# Patient Record
Sex: Male | Born: 1945 | Race: White | Hispanic: No | Marital: Married | Smoking: Never smoker
Health system: Southern US, Community
[De-identification: ages and names within clinical notes are randomized; demographics above are authoritative.]

## PROBLEM LIST (undated history)

## (undated) DIAGNOSIS — C4490 Unspecified malignant neoplasm of skin, unspecified: Secondary | ICD-10-CM

## (undated) DIAGNOSIS — G8929 Other chronic pain: Secondary | ICD-10-CM

## (undated) DIAGNOSIS — G909 Disorder of the autonomic nervous system, unspecified: Secondary | ICD-10-CM

## (undated) DIAGNOSIS — E079 Disorder of thyroid, unspecified: Secondary | ICD-10-CM

## (undated) DIAGNOSIS — I10 Essential (primary) hypertension: Secondary | ICD-10-CM

## (undated) DIAGNOSIS — I7782 Antineutrophilic cytoplasmic antibody (ANCA) vasculitis: Secondary | ICD-10-CM

## (undated) DIAGNOSIS — G4730 Sleep apnea, unspecified: Secondary | ICD-10-CM

## (undated) DIAGNOSIS — G629 Polyneuropathy, unspecified: Secondary | ICD-10-CM

## (undated) DIAGNOSIS — M109 Gout, unspecified: Secondary | ICD-10-CM

## (undated) DIAGNOSIS — D899 Disorder involving the immune mechanism, unspecified: Secondary | ICD-10-CM

## (undated) DIAGNOSIS — I7121 Aneurysm of the ascending aorta, without rupture: Secondary | ICD-10-CM

## (undated) HISTORY — DX: Unspecified malignant neoplasm of skin, unspecified: C44.90

## (undated) HISTORY — PX: GALLBLADDER SURGERY: SHX652

## (undated) HISTORY — DX: Disorder of the autonomic nervous system, unspecified: G90.9

## (undated) HISTORY — DX: Disorder involving the immune mechanism, unspecified: D89.9

## (undated) HISTORY — DX: Other chronic pain: G89.29

## (undated) HISTORY — PX: TOTAL HIP ARTHROPLASTY: SHX124

## (undated) HISTORY — PX: CHOLECYSTECTOMY: SHX55

---

## 2017-02-03 ENCOUNTER — Inpatient Hospital Stay
Admission: EM | Admit: 2017-02-03 | Discharge: 2017-02-04 | DRG: 390 | Disposition: A | Discharge status: Home / Self Care | Payer: Medicare Other | Attending: Internal Medicine | Admitting: Internal Medicine

## 2017-02-03 ENCOUNTER — Emergency Department: Payer: Medicare Other

## 2017-02-03 ENCOUNTER — Encounter: Payer: Self-pay | Admitting: Emergency Medicine

## 2017-02-03 ENCOUNTER — Inpatient Hospital Stay: Payer: Medicare Other

## 2017-02-03 DIAGNOSIS — K567 Ileus, unspecified: Secondary | ICD-10-CM | POA: Diagnosis present

## 2017-02-03 DIAGNOSIS — Z7982 Long term (current) use of aspirin: Secondary | ICD-10-CM

## 2017-02-03 DIAGNOSIS — K56609 Unspecified intestinal obstruction, unspecified as to partial versus complete obstruction: Secondary | ICD-10-CM

## 2017-02-03 DIAGNOSIS — M109 Gout, unspecified: Secondary | ICD-10-CM | POA: Diagnosis not present

## 2017-02-03 DIAGNOSIS — E860 Dehydration: Secondary | ICD-10-CM | POA: Diagnosis not present

## 2017-02-03 DIAGNOSIS — Z4659 Encounter for fitting and adjustment of other gastrointestinal appliance and device: Secondary | ICD-10-CM

## 2017-02-03 DIAGNOSIS — E039 Hypothyroidism, unspecified: Secondary | ICD-10-CM | POA: Diagnosis present

## 2017-02-03 DIAGNOSIS — I10 Essential (primary) hypertension: Secondary | ICD-10-CM | POA: Diagnosis not present

## 2017-02-03 DIAGNOSIS — K529 Noninfective gastroenteritis and colitis, unspecified: Secondary | ICD-10-CM | POA: Diagnosis not present

## 2017-02-03 HISTORY — DX: Gout, unspecified: M10.9

## 2017-02-03 HISTORY — DX: Essential (primary) hypertension: I10

## 2017-02-03 HISTORY — DX: Disorder of thyroid, unspecified: E07.9

## 2017-02-03 LAB — CBC
HEMATOCRIT: 47.9 % (ref 40.0–52.0)
HEMOGLOBIN: 16.3 g/dL (ref 13.0–18.0)
MCH: 30.9 pg (ref 26.0–34.0)
MCHC: 34.1 g/dL (ref 32.0–36.0)
MCV: 90.6 fL (ref 80.0–100.0)
Platelets: 184 10*3/uL (ref 150–440)
RBC: 5.29 MIL/uL (ref 4.40–5.90)
RDW: 14.7 % — ABNORMAL HIGH (ref 11.5–14.5)
WBC: 6.5 10*3/uL (ref 3.8–10.6)

## 2017-02-03 LAB — COMPREHENSIVE METABOLIC PANEL
ALT: 26 U/L (ref 17–63)
ANION GAP: 7 (ref 5–15)
AST: 26 U/L (ref 15–41)
Albumin: 4 g/dL (ref 3.5–5.0)
Alkaline Phosphatase: 51 U/L (ref 38–126)
BUN: 22 mg/dL — ABNORMAL HIGH (ref 6–20)
CALCIUM: 9.2 mg/dL (ref 8.9–10.3)
CHLORIDE: 100 mmol/L — AB (ref 101–111)
CO2: 27 mmol/L (ref 22–32)
Creatinine, Ser: 1.11 mg/dL (ref 0.61–1.24)
GFR calc Af Amer: >60 mL/min (ref >60)
GFR calc non Af Amer: >60 mL/min (ref >60)
Glucose, Bld: 117 mg/dL — ABNORMAL HIGH (ref 65–99)
POTASSIUM: 3.7 mmol/L (ref 3.5–5.1)
SODIUM: 134 mmol/L — AB (ref 135–145)
Total Bilirubin: 0.9 mg/dL (ref 0.3–1.2)
Total Protein: 7.6 g/dL (ref 6.5–8.1)

## 2017-02-03 LAB — URINALYSIS, COMPLETE (UACMP) WITH MICROSCOPIC
Bacteria, UA: NONE SEEN
Bilirubin Urine: NEGATIVE
Glucose, UA: NEGATIVE mg/dL
Hgb urine dipstick: NEGATIVE
KETONES UR: NEGATIVE mg/dL
Leukocytes, UA: NEGATIVE
Nitrite: NEGATIVE
PROTEIN: 30 mg/dL — AB
RBC / HPF: NONE SEEN RBC/hpf (ref 0–5)
Specific Gravity, Urine: 1.029 (ref 1.005–1.030)
Squamous Epithelial / LPF: NONE SEEN
WBC UA: NONE SEEN WBC/hpf (ref 0–5)
pH: 5 (ref 5.0–8.0)

## 2017-02-03 LAB — LIPASE, BLOOD: LIPASE: 20 U/L (ref 11–51)

## 2017-02-03 MED ORDER — IOPAMIDOL (ISOVUE-300) INJECTION 61%
100.0000 mL | Freq: Once | INTRAVENOUS | Status: AC | PRN
  Administered 2017-02-03: 100 mL via INTRAVENOUS
  Filled 2017-02-03: qty 100

## 2017-02-03 MED ORDER — ONDANSETRON HCL 4 MG PO TABS
4.0000 mg | ORAL_TABLET | Freq: Once | ORAL | Status: AC
  Administered 2017-02-03: 4 mg via ORAL
  Filled 2017-02-03: qty 1

## 2017-02-03 MED ORDER — KETOROLAC TROMETHAMINE 15 MG/ML IJ SOLN: 15.0000 mg | Freq: Four times a day (QID) | INTRAMUSCULAR | Status: DC | PRN

## 2017-02-03 MED ORDER — ONDANSETRON HCL 4 MG PO TABS: 4.0000 mg | ORAL_TABLET | Freq: Four times a day (QID) | ORAL | Status: DC | PRN

## 2017-02-03 MED ORDER — HYDRALAZINE HCL 20 MG/ML IJ SOLN: 10.0000 mg | Freq: Four times a day (QID) | INTRAMUSCULAR | Status: DC | PRN

## 2017-02-03 MED ORDER — HYDROCODONE-ACETAMINOPHEN 5-325 MG PO TABS: 1.0000 | ORAL_TABLET | ORAL | Status: DC | PRN

## 2017-02-03 MED ORDER — LEVOTHYROXINE SODIUM 100 MCG IV SOLR
37.5000 ug | Freq: Every day | INTRAVENOUS | Status: DC
  Administered 2017-02-03: 37.5 ug via INTRAVENOUS
  Filled 2017-02-03 (×2): qty 5

## 2017-02-03 MED ORDER — ACETAMINOPHEN 325 MG PO TABS
650.0000 mg | ORAL_TABLET | Freq: Four times a day (QID) | ORAL | Status: DC | PRN
Start: 2017-02-03 — End: 2017-02-04

## 2017-02-03 MED ORDER — ONDANSETRON HCL 4 MG/2ML IJ SOLN: 4.0000 mg | Freq: Four times a day (QID) | INTRAMUSCULAR | Status: DC | PRN

## 2017-02-03 MED ORDER — GI COCKTAIL ~~LOC~~
30.0000 mL | Freq: Once | ORAL | Status: AC
  Administered 2017-02-03: 30 mL via ORAL
  Filled 2017-02-03: qty 30

## 2017-02-03 MED ORDER — SENNOSIDES-DOCUSATE SODIUM 8.6-50 MG PO TABS: 1.0000 | ORAL_TABLET | Freq: Every evening | ORAL | Status: DC | PRN

## 2017-02-03 MED ORDER — ENOXAPARIN SODIUM 40 MG/0.4ML ~~LOC~~ SOLN: 40.0000 mg | SUBCUTANEOUS | Status: DC

## 2017-02-03 MED ORDER — DICYCLOMINE HCL 10 MG PO CAPS
10.0000 mg | ORAL_CAPSULE | Freq: Once | ORAL | Status: AC
  Administered 2017-02-03: 10 mg via ORAL
  Filled 2017-02-03: qty 1

## 2017-02-03 MED ORDER — SODIUM CHLORIDE 0.9 % IV SOLN
INTRAVENOUS | Status: DC
  Administered 2017-02-03 – 2017-02-04 (×2): via INTRAVENOUS

## 2017-02-03 MED ORDER — IOPAMIDOL (ISOVUE-300) INJECTION 61%
30.0000 mL | Freq: Once | INTRAVENOUS | Status: AC
  Administered 2017-02-03: 30 mL via ORAL
  Filled 2017-02-03: qty 30

## 2017-02-03 MED ORDER — ACETAMINOPHEN 650 MG RE SUPP
650.0000 mg | Freq: Four times a day (QID) | RECTAL | Status: DC | PRN
Start: 2017-02-03 — End: 2017-02-04

## 2017-02-03 MED ORDER — KETOROLAC TROMETHAMINE 15 MG/ML IJ SOLN
15.0000 mg | Freq: Four times a day (QID) | INTRAMUSCULAR | Status: DC | PRN
  Filled 2017-02-03: qty 1

## 2017-02-03 MED ORDER — SODIUM CHLORIDE 0.9 % IV BOLUS (SEPSIS)
1000.0000 mL | Freq: Once | INTRAVENOUS | Status: AC
  Administered 2017-02-03: 1000 mL via INTRAVENOUS

## 2017-02-03 NOTE — ED Notes (Signed)
Pt reports that he is having abd pain located midline of abd with nausea/vomiting and diarrhea - in 24 hours vomited x4 - in 24 hours 5 loose stools - pt has been sick since yesterday am

## 2017-02-03 NOTE — ED Notes (Signed)
Pt assisted to bathroom and was able to void and pass a large amount of gas - suction canister changed

## 2017-02-03 NOTE — ED Triage Notes (Addendum)
Pt to ed with c/o nausea, vomiting, diarrhea, and abd pain x 2 days.

## 2017-02-03 NOTE — Consult Note (Signed)
Patient ID: SEAB AXEL, male   DOB: 1946/06/05, 71 y.o.   MRN: [id number]  HPI Logan Murphy is a 71 y.o. male asked to see in consultation by Dr. Clearnce Hasten. He reports that his symptoms started yesterday with nausea vomiting and diarrhea. He had about 5 episodes of watery bowel movement yesterday and about 2 episodes of emesis. He reports some mild to moderate intermittent crampy abdominal pain. Pain was moderate in intensity and diffusely. No significant precipitating or out of eating factors. He has never had any episodes like this and denies any sick contacts. He has good cardiovascular performance and is able to do more than 4 Mets without shortness of breath or chest pain.  The workup including a CT scan of the abdomen and pelvis that I have personally reviewed. There is evidence of dilated loops of small bowel diffusely without any transition zone. There is no evidence of pneumatosis there is no evidence of free air. There is no evidence of bowel ischemia. There is air within the colon. Nml WBC  HPI  Past Medical History:  Diagnosis Date  . Gout   . Hypertension   . Thyroid disease     History reviewed. No pertinent surgical history.  History reviewed. No pertinent family history.  Social History Social History  Substance Use Topics  . Smoking status: Never Smoker  . Smokeless tobacco: Never Used  . Alcohol use Yes    No Known Allergies  No current facility-administered medications for this encounter.    Current Outpatient Prescriptions  Medication Sig Dispense Refill  . allopurinol (ZYLOPRIM) 300 MG tablet Take 300 mg by mouth daily.    Marland Kitchen aspirin EC 81 MG tablet Take 81 mg by mouth daily.    . Cholecalciferol 400 units CAPS Take 400 Units by mouth daily.     Marland Kitchen levothyroxine (SYNTHROID, LEVOTHROID) 75 MCG tablet Take 75 mcg by mouth daily.    Marland Kitchen lisinopril-hydrochlorothiazide (PRINZIDE,ZESTORETIC) 10-12.5 MG tablet Take 1 tablet by mouth daily.       Review of  Systems A 10 point review of systems was asked and was negative except for the information on the HPI  Physical Exam Blood pressure (!) 151/77, pulse 79, temperature 97.6 F (36.4 C), temperature source Oral, resp. rate 18, height 6\' 1"  (1.854 m), weight 104.3 kg (230 lb), SpO2 95 %. CONSTITUTIONAL: NAD EYES: Pupils are equal, round, and reactive to light, Sclera are non-icteric. EARS, NOSE, MOUTH AND THROAT: The oropharynx is clear. The oral mucosa is pink and moist. Hearing is intact to voice. LYMPH NODES:  Lymph nodes in the neck are normal. RESPIRATORY:  Lungs are clear. There is normal respiratory effort, with equal breath sounds bilaterally, and without pathologic use of accessory muscles. CARDIOVASCULAR: Heart is regular without murmurs, gallops, or rubs. GI: The abdomen is  soft, nontender, distended. Decrease BS, no peritonitisGU: Rectal deferred.   MUSCULOSKELETAL: Normal muscle strength and tone. No cyanosis or edema.   SKIN: Turgor is good and there are no pathologic skin lesions or ulcers. NEUROLOGIC: Motor and sensation is grossly normal. Cranial nerves are grossly intact. PSYCH:  Oriented to person, place and time. Affect is normal.  Data Reviewed I have personally reviewed the patient's imaging, laboratory findings and medical records.    Assessment/Plan 71 year old male with clinical findings consistent with enteritis versus ileus. Clinically he does not have a bowel obstruction and radiographically there is evidence of air with in the colon. Recommend IV fluids, NG tube decompression since he  has significant dilation of the stomach. And correction of electrolytes and dehydration accordingly. No need for any surgical intervention at this time will be happy to be available if any surgical issues arise. Discussed with Dr.Schaevitz in detail.  Caroleen Hamman, MD FACS General Surgeon 02/03/2017, 6:14 PM

## 2017-02-03 NOTE — ED Provider Notes (Addendum)
[facility] Emergency Department Provider Note  ____________________________________________   First MD Initiated Contact with Patient 02/03/17 1427     (approximate)  I have reviewed the triage vital signs and the nursing notes.   HISTORY  Chief Complaint Nausea; Emesis; and Diarrhea   HPI Logan Murphy is a 71 y.o. male with a history of hypertension and cholecystectomy who is presenting emergency department with nausea vomiting and diarrhea as well as upper abdominal pain which she describes as a 4-10 and sharp at this time. He denies any radiation of the pain. Says that he has vomited onceand also had an episode of diarrhea this morning. He says that he is passing minimal amount of gas but usually passes more. He is also concerned about distention of his abdomen and if it could be obstructed. Also with slight cough. No known sick contacts.   Past Medical History:  Diagnosis Date  . Gout   . Hypertension   . Thyroid disease     There are no active problems to display for this patient.   History reviewed. No pertinent surgical history.  Prior to Admission medications   Not on File    Allergies Patient has no known allergies.  History reviewed. No pertinent family history.  Social History Social History  Substance Use Topics  . Smoking status: Never Smoker  . Smokeless tobacco: Never Used  . Alcohol use Yes    Review of Systems Constitutional: No fever/chills Eyes: No visual changes. ENT: No sore throat. Cardiovascular: Denies chest pain. Respiratory: Denies shortness of breath. Gastrointestinal:   No constipation. Genitourinary: Negative for dysuria. Musculoskeletal: Negative for back pain. Skin: Negative for rash. Neurological: Negative for headaches, focal weakness or numbness.  10-point ROS otherwise negative.  ____________________________________________   PHYSICAL EXAM:  VITAL SIGNS: ED Triage Vitals  Enc Vitals  Group     BP 02/03/17 1300 (!) 151/77     Pulse Rate 02/03/17 1300 79     Resp 02/03/17 1300 18     Temp 02/03/17 1300 97.6 F (36.4 C)     Temp Source 02/03/17 1300 Oral     SpO2 02/03/17 1300 95 %     Weight 02/03/17 1301 230 lb (104.3 kg)     Height 02/03/17 1301 6\' 1"  (1.854 m)     Head Circumference --      Peak Flow --      Pain Score 02/03/17 1302 2     Pain Loc --      Pain Edu? --      Excl. in [place]? --     Constitutional: Alert and oriented. Well appearing and in no acute distress. Eyes: Conjunctivae are normal. PERRL. EOMI. Head: Atraumatic. Nose: No congestion/rhinnorhea. Mouth/Throat: Mucous membranes are moist.   Neck: No stridor.   Cardiovascular: Normal rate, regular rhythm. Grossly normal heart sounds.   Respiratory: Normal respiratory effort.  No retractions. Lungs CTAB. Gastrointestinal: Soft With tenderness to left upper quadrant as well as the left side of the abdomen. Minimal left lower quadrant tenderness palpation. No right lower quadrant tenderness or right upper quadrant tenderness to palpation. The tenderness on the left side of the abdomen is mild to moderate without any rebound or guarding. No distention.  Musculoskeletal: No lower extremity tenderness nor edema.  No joint effusions. Neurologic:  Normal speech and language. No gross focal neurologic deficits are appreciated. No gait instability. Skin:  Skin is warm, dry and intact. No rash noted. Psychiatric: Mood and  affect are normal. Speech and behavior are normal.  ____________________________________________   LABS (all labs ordered are listed, but only abnormal results are displayed)  Labs Reviewed  COMPREHENSIVE METABOLIC PANEL - Abnormal; Notable for the following:       Result Value   Sodium 134 (*)    Chloride 100 (*)    Glucose, Bld 117 (*)    BUN 22 (*)    All other components within normal limits  CBC - Abnormal; Notable for the following:    RDW 14.7 (*)    All other components  within normal limits  URINALYSIS, COMPLETE (UACMP) WITH MICROSCOPIC - Abnormal; Notable for the following:    Color, Urine AMBER (*)    APPearance CLEAR (*)    Protein, ur 30 (*)    All other components within normal limits  LIPASE, BLOOD   ____________________________________________  EKG   ____________________________________________  RADIOLOGY    DG Abdomen 1 View (Final result)  Result time 02/03/17 15:40:22  Final result by Inez Catalina, MD (02/03/17 15:40:22)           Narrative:   CLINICAL DATA: Mid abdominal pain with nausea and vomiting for 1 day, initial encounter  EXAM: ABDOMEN - 1 VIEW  COMPARISON: None.  FINDINGS: Scattered large and small bowel gas is noted. Some dilated loops of small bowel are noted within the mid abdomen. This may represent a partial small bowel obstruction or small-bowel ileus. Correlation with the physical exam is recommended. No free air is seen. No abnormal mass is noted.  IMPRESSION: Multiple dilated loops of small bowel in the mid abdomen with evidence of colonic air. This may simply represent a small-bowel ileus although partial small bowel obstruction deserves consideration. Correlation with the physical exam is recommended.   Electronically Signed By: Inez Catalina M.D. On: 02/03/2017 15:40          CT Abdomen Pelvis W Contrast (Final result)  Result time 02/03/17 17:13:04  Final result by Enrique Sack, MD (02/03/17 17:13:04)           Narrative:   CLINICAL DATA: Mid abdominal pain, nausea, vomiting and diarrhea since yesterday morning.  EXAM: CT ABDOMEN AND PELVIS WITH CONTRAST  TECHNIQUE: Multidetector CT imaging of the abdomen and pelvis was performed using the standard protocol following bolus administration of intravenous contrast.  CONTRAST: 164mL ISOVUE-300 IOPAMIDOL (ISOVUE-300) INJECTION 61%  COMPARISON: Abdomen pelvis radiographs obtained earlier today.  FINDINGS: Lower chest:  Patchy opacity in the medial aspect of the lingula on the initial images, not included in its entirety. No pleural fluid.  Hepatobiliary: Diffuse low density of the liver relative to the spleen. Cholecystectomy clips.  Pancreas: Unremarkable. No pancreatic ductal dilatation or surrounding inflammatory changes.  Spleen: Normal in size without focal abnormality.  Adrenals/Urinary Tract: Adrenal glands are unremarkable. Kidneys are normal, without renal calculi, focal lesion, or hydronephrosis. Bladder is unremarkable.  Stomach/Bowel: Dilated stomach with reflux contrast in the distal esophagus. Multiple dilated loops of proximal jejunum with normal caliber distal small bowel. The transition to normal caliber small bowel appears to be gradual in the left lower abdomen without a discrete transition point identified. No bowel wall thickening or pneumatosis seen. Mild swirling of the small bowel mesentery in the mid abdomen. Multiple colonic diverticula. Normal appearing appendix.  Vascular/Lymphatic: Atheromatous arterial calcifications, including the abdominal aorta, without aneurysm. No enlarged lymph nodes.  Reproductive: Moderately enlarged prostate gland. This is protruding into the base of the urinary bladder.  Other: No hernia, free peritoneal  fluid or free peritoneal air seen.  Musculoskeletal: Lumbar and lower thoracic spine degenerative changes. Bilateral hip degenerative changes, greater on the right.  IMPRESSION: 1. Partial small bowel obstruction with no cause of obstruction identified. 2. Patchy airspace opacity in the medial aspect of the lingula, suspicious for pneumonia. 3. Diffuse hepatic steatosis. 4. Mild aortic atherosclerosis. 5. Moderately enlarged prostate gland.   Electronically Signed By: Claudie Revering M.D. On: 02/03/2017 17:13             ____________________________________________   PROCEDURES  Procedure(s) performed:    Procedures  Critical Care performed:   ____________________________________________   INITIAL IMPRESSION / ASSESSMENT AND PLAN / ED COURSE  Pertinent labs & imaging results that were available during my care of the patient were reviewed by me and considered in my medical decision making (see chart for details).  ----------------------------------------- 4:13 PM on 02/03/2017 -----------------------------------------  Patient still with 3 out of 10 pain and has not had any flatus or bowel movements. We will CAT scan his abdomen to get further information regarding his ileus versus obstruction.    ----------------------------------------- 5:38 PM on 02/03/2017 -----------------------------------------  Patient with what appears to be a partial small bowel obstruction on his CAT scan. Discussed the case with the patient and his family as well as Dr. Dahlia Byes of the surgical service. Patient to be admitted to the hospital.   ____________________________________________   FINAL CLINICAL IMPRESSION(S) / ED DIAGNOSES  Partial small bowel obstruction.    NEW MEDICATIONS STARTED DURING THIS VISIT:  New Prescriptions   No medications on file     Note:  This document was prepared using Dragon voice recognition software and may include unintentional dictation errors.    Orbie Pyo, MD 02/03/17 1739  Dr.Pabon is suspecting enteritis versus ileus. Still recommends nasogastric tube because of the amount of distention however he does recommend the patient be admitted to medicine. Signed out to Dr. Genia Harold.     Orbie Pyo, MD 02/03/17 [id number]

## 2017-02-03 NOTE — ED Notes (Signed)
CT notified that pt had finished drinking contrast

## 2017-02-03 NOTE — ED Notes (Signed)
Called pharmacy about Synthroid IV, states they will talk to the pharmacist.

## 2017-02-03 NOTE — H&P (Signed)
[facility] [place] NAME: Logan Murphy    MR#:  [id number]  DATE OF BIRTH:  Jun 06, 1946  DATE OF ADMISSION:  02/03/2017  PRIMARY CARE PHYSICIAN: No PCP Per Patient   REQUESTING/REFERRING PHYSICIAN: dr Dineen Kid  CHIEF COMPLAINT:   Abdominal pain HISTORY OF PRESENT ILLNESS:  Logan Murphy  is a 71 y.o. male with a known history of Hypothyroidism who presents with above complaint. Patient reports over the past day he has had nausea, vomiting and diarrhea. He also states he has had abdominal distention with abdominal pain. His last large bowel movement was 3 days ago but since then he's had some diarrhea. He has had surgery many years ago for his gallbladder. He does not take chronic narcotics. Patient was evaluated by surgery while in the ER for ileus and recommendations were to admit to hospital service and place NG tube.  PAST MEDICAL HISTORY:   Past Medical History:  Diagnosis Date  . Gout   . Hypertension   . Thyroid disease     PAST SURGICAL HISTORY:  Gallbladder surgery  SOCIAL HISTORY:   Social History  Substance Use Topics  . Smoking status: Never Smoker  . Smokeless tobacco: Never Used  . Alcohol use Yes    FAMILY HISTORY:  No CAD  DRUG ALLERGIES:  No Known Allergies  REVIEW OF SYSTEMS:   Review of Systems  Constitutional: Negative.  Negative for chills, fever and malaise/fatigue.  HENT: Negative.  Negative for ear discharge, ear pain, hearing loss, nosebleeds and sore throat.   Eyes: Negative.  Negative for blurred vision and pain.  Respiratory: Negative.  Negative for cough, hemoptysis, shortness of breath and wheezing.   Cardiovascular: Negative.  Negative for chest pain, palpitations and leg swelling.  Gastrointestinal: Positive for abdominal pain, diarrhea, nausea and vomiting. Negative for blood in stool.  Genitourinary: Negative.  Negative for dysuria.  Musculoskeletal: Negative.  Negative for back pain.   Skin: Negative.   Neurological: Negative for dizziness, tremors, speech change, focal weakness, seizures and headaches.  Endo/Heme/Allergies: Negative.  Does not bruise/bleed easily.  Psychiatric/Behavioral: Negative.  Negative for depression, hallucinations and suicidal ideas.    MEDICATIONS AT HOME:   Prior to Admission medications   Medication Sig Start Date End Date Taking? Authorizing Provider  allopurinol (ZYLOPRIM) 300 MG tablet Take 300 mg by mouth daily.   Yes Historical Provider, MD  aspirin EC 81 MG tablet Take 81 mg by mouth daily.   Yes Historical Provider, MD  Cholecalciferol 400 units CAPS Take 400 Units by mouth daily.    Yes Historical Provider, MD  levothyroxine (SYNTHROID, LEVOTHROID) 75 MCG tablet Take 75 mcg by mouth daily.   Yes Historical Provider, MD  lisinopril-hydrochlorothiazide (PRINZIDE,ZESTORETIC) 10-12.5 MG tablet Take 1 tablet by mouth daily.   Yes Historical Provider, MD      VITAL SIGNS:  Blood pressure (!) 151/77, pulse 79, temperature 97.6 F (36.4 C), temperature source Oral, resp. rate 18, height 6\' 1"  (1.854 m), weight 104.3 kg (230 lb), SpO2 95 %.  PHYSICAL EXAMINATION:   Physical Exam  Constitutional: He is oriented to person, place, and time and well-developed, well-nourished, and in no distress. No distress.  HENT:  Head: Normocephalic.  Eyes: No scleral icterus.  Neck: Normal range of motion. Neck supple. No JVD present. No tracheal deviation present.  Cardiovascular: Normal rate, regular rhythm and normal heart sounds.  Exam reveals no gallop and no friction rub.   No murmur heard. Pulmonary/Chest:  Effort normal and breath sounds normal. No respiratory distress. He has no wheezes. He has no rales. He exhibits no tenderness.  Abdominal: He exhibits distension. He exhibits no mass. There is tenderness. There is no rebound and no guarding.  Hypoactive  Musculoskeletal: Normal range of motion. He exhibits no edema.  Neurological: He is  alert and oriented to person, place, and time.  Skin: Skin is warm. No rash noted. No erythema.  Psychiatric: Affect and judgment normal.      LABORATORY PANEL:   CBC  Recent Labs Lab 02/03/17 1304  WBC 6.5  HGB 16.3  HCT 47.9  PLT 184   ------------------------------------------------------------------------------------------------------------------  Chemistries   Recent Labs Lab 02/03/17 1304  NA 134*  K 3.7  CL 100*  CO2 27  GLUCOSE 117*  BUN 22*  CREATININE 1.11  CALCIUM 9.2  AST 26  ALT 26  ALKPHOS 51  BILITOT 0.9   ------------------------------------------------------------------------------------------------------------------  Cardiac Enzymes No results for input(s): TROPONINI in the last 168 hours. ------------------------------------------------------------------------------------------------------------------  RADIOLOGY:  Dg Abdomen 1 View  Result Date: 02/03/2017 CLINICAL DATA:  Mid abdominal pain with nausea and vomiting for 1 day, initial encounter EXAM: ABDOMEN - 1 VIEW COMPARISON:  None. FINDINGS: Scattered large and small bowel gas is noted. Some dilated loops of small bowel are noted within the mid abdomen. This may represent a partial small bowel obstruction or small-bowel ileus. Correlation with the physical exam is recommended. No free air is seen. No abnormal mass is noted. IMPRESSION: Multiple dilated loops of small bowel in the mid abdomen with evidence of colonic air. This may simply represent a small-bowel ileus although partial small bowel obstruction deserves consideration. Correlation with the physical exam is recommended. Electronically Signed   By: Inez Catalina M.D.   On: 02/03/2017 15:40   Ct Abdomen Pelvis W Contrast  Result Date: 02/03/2017 CLINICAL DATA:  Mid abdominal pain, nausea, vomiting and diarrhea since yesterday morning. EXAM: CT ABDOMEN AND PELVIS WITH CONTRAST TECHNIQUE: Multidetector CT imaging of the abdomen and pelvis  was performed using the standard protocol following bolus administration of intravenous contrast. CONTRAST:  146mL ISOVUE-300 IOPAMIDOL (ISOVUE-300) INJECTION 61% COMPARISON:  Abdomen pelvis radiographs obtained earlier today. FINDINGS: Lower chest: Patchy opacity in the medial aspect of the lingula on the initial images, not included in its entirety. No pleural fluid. Hepatobiliary: Diffuse low density of the liver relative to the spleen. Cholecystectomy clips. Pancreas: Unremarkable. No pancreatic ductal dilatation or surrounding inflammatory changes. Spleen: Normal in size without focal abnormality. Adrenals/Urinary Tract: Adrenal glands are unremarkable. Kidneys are normal, without renal calculi, focal lesion, or hydronephrosis. Bladder is unremarkable. Stomach/Bowel: Dilated stomach with reflux contrast in the distal esophagus. Multiple dilated loops of proximal jejunum with normal caliber distal small bowel. The transition to normal caliber small bowel appears to be gradual in the left lower abdomen without a discrete transition point identified. No bowel wall thickening or pneumatosis seen. Mild swirling of the small bowel mesentery in the mid abdomen. Multiple colonic diverticula. Normal appearing appendix. Vascular/Lymphatic: Atheromatous arterial calcifications, including the abdominal aorta, without aneurysm. No enlarged lymph nodes. Reproductive: Moderately enlarged prostate gland. This is protruding into the base of the urinary bladder. Other: No hernia, free peritoneal fluid or free peritoneal air seen. Musculoskeletal: Lumbar and lower thoracic spine degenerative changes. Bilateral hip degenerative changes, greater on the right. IMPRESSION: 1. Partial small bowel obstruction with no cause of obstruction identified. 2. Patchy airspace opacity in the medial aspect of the lingula, suspicious for pneumonia.  3. Diffuse hepatic steatosis. 4. Mild aortic atherosclerosis. 5. Moderately enlarged prostate  gland. Electronically Signed   By: Claudie Revering M.D.   On: 02/03/2017 17:13   Dg Abd Portable 1 View  Result Date: 02/03/2017 CLINICAL DATA:  Ng tube placement EXAM: PORTABLE ABDOMEN - 1 VIEW COMPARISON:  CT abdomen pelvis - 02/03/2017 FINDINGS: Enteric tube tip and side port project over the expected location of the gastric antrum. Re- demonstrated moderate gas distention of multiple loops of small bowel with index loop of small bowel within the mid abdomen measuring approximately 3.1 cm in diameter. This findings again associated with a paucity of distal colonic gas. Nondiagnostic evaluation for pneumoperitoneum secondary supine positioning and exclusion of the lower thorax. No pneumatosis or portal venous gas. Stigmata of DISH within the lower thoracic spine. IMPRESSION: 1. Enteric tube tip and side port projected the expected location of the gastric antrum. 2. Similar findings worrisome for partial small bowel obstruction. Electronically Signed   By: Sandi Mariscal M.D.   On: 02/03/2017 18:59    EKG:  No orders found for this or any previous visit.  IMPRESSION AND PLAN:   71 year old male with history of hypothyroidism and hypertension who presents with abdominal pain and found to have ileus.  1. Partial small bowel obstruction/ileus: Management as per surgery.. No surgical intervention at this time as per surgery.   Continue NG tube and IV fluids. KUB in a.m. Pain control  2. Essential hypertension: Hold by mouth medications When necessary hydralazine  3. Hypothyroidism: IV Synthroid until patient is able to take in PO.  4. Hx Gout: Holding Allopurinol for now  All the records are reviewed and case discussed with ED provider. Management plans discussed with the patient and he is in agreement  CODE STATUS: full  TOTAL TIME TAKING CARE OF THIS PATIENT: 40 minutes.    Josten Warmuth M.D on 02/03/2017 at 7:50 PM  Between 7am to 6pm - Pager - [phone]  After 6pm go to  [email or web address] - password [email or web address] [facility]  Office  [phone]  CC: Primary care physician; No PCP Per Patient

## 2017-02-04 DIAGNOSIS — K56609 Unspecified intestinal obstruction, unspecified as to partial versus complete obstruction: Secondary | ICD-10-CM | POA: Diagnosis not present

## 2017-02-04 DIAGNOSIS — K567 Ileus, unspecified: Secondary | ICD-10-CM

## 2017-02-04 LAB — CBC
HEMATOCRIT: 44.3 % (ref 40.0–52.0)
Hemoglobin: 14.8 g/dL (ref 13.0–18.0)
MCH: 30.4 pg (ref 26.0–34.0)
MCHC: 33.3 g/dL (ref 32.0–36.0)
MCV: 91.1 fL (ref 80.0–100.0)
PLATELETS: 164 10*3/uL (ref 150–440)
RBC: 4.87 MIL/uL (ref 4.40–5.90)
RDW: 14.8 % — AB (ref 11.5–14.5)
WBC: 5.8 10*3/uL (ref 3.8–10.6)

## 2017-02-04 LAB — BASIC METABOLIC PANEL
Anion gap: 6 (ref 5–15)
BUN: 19 mg/dL (ref 6–20)
CHLORIDE: 103 mmol/L (ref 101–111)
CO2: 30 mmol/L (ref 22–32)
CREATININE: 0.88 mg/dL (ref 0.61–1.24)
Calcium: 8.3 mg/dL — ABNORMAL LOW (ref 8.9–10.3)
GFR calc Af Amer: >60 mL/min (ref >60)
GFR calc non Af Amer: >60 mL/min (ref >60)
Glucose, Bld: 100 mg/dL — ABNORMAL HIGH (ref 65–99)
POTASSIUM: 4 mmol/L (ref 3.5–5.1)
Sodium: 139 mmol/L (ref 135–145)

## 2017-02-04 MED ORDER — LISINOPRIL-HYDROCHLOROTHIAZIDE 10-12.5 MG PO TABS: 1.0000 | ORAL_TABLET | Freq: Every day | ORAL | Status: DC

## 2017-02-04 MED ORDER — LEVOTHYROXINE SODIUM 75 MCG PO TABS: 75.0000 ug | ORAL_TABLET | Freq: Every day | ORAL | Status: DC

## 2017-02-04 MED ORDER — LISINOPRIL 10 MG PO TABS
10.0000 mg | ORAL_TABLET | Freq: Every day | ORAL | Status: DC
  Administered 2017-02-04: 10 mg via ORAL
  Filled 2017-02-04: qty 1

## 2017-02-04 MED ORDER — ALLOPURINOL 100 MG PO TABS: 300.0000 mg | ORAL_TABLET | Freq: Every day | ORAL | Status: DC

## 2017-02-04 MED ORDER — LEVOTHYROXINE SODIUM 100 MCG IV SOLR
37.5000 ug | Freq: Every day | INTRAVENOUS | Status: DC
  Filled 2017-02-04: qty 5

## 2017-02-04 MED ORDER — CHOLECALCIFEROL 10 MCG (400 UNIT) PO TABS
400.0000 [IU] | ORAL_TABLET | Freq: Every day | ORAL | Status: DC
  Filled 2017-02-04: qty 1

## 2017-02-04 MED ORDER — ASPIRIN EC 81 MG PO TBEC
81.0000 mg | DELAYED_RELEASE_TABLET | Freq: Every day | ORAL | Status: DC
Start: 2017-02-04 — End: 2017-02-04

## 2017-02-04 MED ORDER — HYDROCHLOROTHIAZIDE 12.5 MG PO CAPS
12.5000 mg | ORAL_CAPSULE | Freq: Every day | ORAL | Status: DC
  Administered 2017-02-04: 12.5 mg via ORAL
  Filled 2017-02-04: qty 1

## 2017-02-04 NOTE — Discharge Summary (Signed)
[facility] NAME: Logan Murphy    MR#:  [id number]  DATE OF BIRTH:  1946-07-19  DATE OF ADMISSION:  02/03/2017 ADMITTING PHYSICIAN: Bettey Costa, MD  DATE OF DISCHARGE: 02/04/17  PRIMARY CARE PHYSICIAN: No PCP Per Patient    ADMISSION DIAGNOSIS:  SBO (small bowel obstruction) [K56.609]  DISCHARGE DIAGNOSIS:  Ileus due to acute gastroenteritis-imrpoving  SECONDARY DIAGNOSIS:   Past Medical History:  Diagnosis Date  . Gout   . Hypertension   . Thyroid disease     HOSPITAL COURSE:   71 year old male with history of hypothyroidism and hypertension who presents with abdominal pain and found to have ileus.  1. Partial small bowel obstruction/ileus: Management as per surgery.. No surgical intervention at this time as per surgery.  Pt Tolerating clear liquids well. Feels a lot better. He is involuting well. Advance to full liquid diet. Okay from surgical standpoint to go home. -Patient received IV fluids.  2. Essential hypertension: Resume home meds When necessary hydralazine  3. Hypothyroidism: Oral Synthroid   4. Hx Gout: Resume Allopurinol  Overall better. We'll discharge to home. Patient agreeable. Spoke with surgery okay with the plan  CONSULTS OBTAINED:    DRUG ALLERGIES:  No Known Allergies  DISCHARGE MEDICATIONS:   Current Discharge Medication List    CONTINUE these medications which have NOT CHANGED   Details  allopurinol (ZYLOPRIM) 300 MG tablet Take 300 mg by mouth daily.    aspirin EC 81 MG tablet Take 81 mg by mouth daily.    Cholecalciferol 400 units CAPS Take 400 Units by mouth daily.     levothyroxine (SYNTHROID, LEVOTHROID) 75 MCG tablet Take 75 mcg by mouth daily.    lisinopril-hydrochlorothiazide (PRINZIDE,ZESTORETIC) 10-12.5 MG tablet Take 1 tablet by mouth daily.        If you experience worsening of your admission symptoms, develop shortness of breath, life threatening  emergency, suicidal or homicidal thoughts you must seek medical attention immediately by calling 911 or calling your MD immediately  if symptoms less severe.  You Must read complete instructions/literature along with all the possible adverse reactions/side effects for all the Medicines you take and that have been prescribed to you. Take any new Medicines after you have completely understood and accept all the possible adverse reactions/side effects.   Please note  You were cared for by a hospitalist during your hospital stay. If you have any questions about your discharge medications or the care you received while you were in the hospital after you are discharged, you can call the unit and asked to speak with the hospitalist on call if the hospitalist that took care of you is not available. Once you are discharged, your primary care physician will handle any further medical issues. Please note that NO REFILLS for any discharge medications will be authorized once you are discharged, as it is imperative that you return to your primary care physician (or establish a relationship with a primary care physician if you do not have one) for your aftercare needs so that they can reassess your need for medications and monitor your lab values. Today   SUBJECTIVE   I feel much better. No diarrhea and no vomiting  VITAL SIGNS:  Blood pressure (!) 142/75, pulse 66, temperature 98.2 F (36.8 C), temperature source Oral, resp. rate 18, height 6\' 1"  (1.854 m), weight 104.3 kg (230 lb), SpO2 96 %.  I/O:   Intake/Output Summary (Last 24 hours) at 02/04/17  [place] filed at 02/04/17 1126  Gross per 24 hour  Intake          1909.67 ml  Output             2550 ml  Net          -640.33 ml    PHYSICAL EXAMINATION:  GENERAL:  71 y.o.-year-old patient lying in the bed with no acute distress.  EYES: Pupils equal, round, reactive to light and accommodation. No scleral icterus. Extraocular muscles intact.   HEENT: Head atraumatic, normocephalic. Oropharynx and nasopharynx clear.  NECK:  Supple, no jugular venous distention. No thyroid enlargement, no tenderness.  LUNGS: Normal breath sounds bilaterally, no wheezing, rales,rhonchi or crepitation. No use of accessory muscles of respiration.  CARDIOVASCULAR: S1, S2 normal. No murmurs, rubs, or gallops.  ABDOMEN: Soft, non-tender, non-distended. Bowel sounds present. No organomegaly or mass.  EXTREMITIES: No pedal edema, cyanosis, or clubbing.  NEUROLOGIC: Cranial nerves II through XII are intact. Muscle strength 5/5 in all extremities. Sensation intact. Gait not checked.  PSYCHIATRIC: The patient is alert and oriented x 3.  SKIN: No obvious rash, lesion, or ulcer.   DATA REVIEW:   CBC   Recent Labs Lab 02/04/17 0651  WBC 5.8  HGB 14.8  HCT 44.3  PLT 164    Chemistries   Recent Labs Lab 02/03/17 1304 02/04/17 0651  NA 134* 139  K 3.7 4.0  CL 100* 103  CO2 27 30  GLUCOSE 117* 100*  BUN 22* 19  CREATININE 1.11 0.88  CALCIUM 9.2 8.3*  AST 26  --   ALT 26  --   ALKPHOS 51  --   BILITOT 0.9  --     Microbiology Results   No results found for this or any previous visit (from the past 240 hour(s)).  RADIOLOGY:  Dg Abd 1 View  Result Date: 02/04/2017 CLINICAL DATA:  Enteric tube placement.  Initial encounter. EXAM: ABDOMEN - 1 VIEW COMPARISON:  Abdominal radiograph performed earlier today at 6:35 p.m. FINDINGS: The patient's enteric tube is noted ending overlying the body of the stomach. The visualized bowel gas pattern is grossly unremarkable. Clips are noted within the right upper quadrant, reflecting prior cholecystectomy. No acute osseous abnormalities are seen. IMPRESSION: Enteric tube noted ending overlying the body of the stomach. Electronically Signed   By: Garald Balding M.D.   On: 02/04/2017 03:08   Dg Abdomen 1 View  Result Date: 02/03/2017 CLINICAL DATA:  Mid abdominal pain with nausea and vomiting for 1 day,  initial encounter EXAM: ABDOMEN - 1 VIEW COMPARISON:  None. FINDINGS: Scattered large and small bowel gas is noted. Some dilated loops of small bowel are noted within the mid abdomen. This may represent a partial small bowel obstruction or small-bowel ileus. Correlation with the physical exam is recommended. No free air is seen. No abnormal mass is noted. IMPRESSION: Multiple dilated loops of small bowel in the mid abdomen with evidence of colonic air. This may simply represent a small-bowel ileus although partial small bowel obstruction deserves consideration. Correlation with the physical exam is recommended. Electronically Signed   By: Inez Catalina M.D.   On: 02/03/2017 15:40   Ct Abdomen Pelvis W Contrast  Result Date: 02/03/2017 CLINICAL DATA:  Mid abdominal pain, nausea, vomiting and diarrhea since yesterday morning. EXAM: CT ABDOMEN AND PELVIS WITH CONTRAST TECHNIQUE: Multidetector CT imaging of the abdomen and pelvis was performed using the standard protocol following bolus administration of intravenous contrast. CONTRAST:  124mL ISOVUE-300 IOPAMIDOL (ISOVUE-300) INJECTION 61% COMPARISON:  Abdomen pelvis radiographs obtained earlier today. FINDINGS: Lower chest: Patchy opacity in the medial aspect of the lingula on the initial images, not included in its entirety. No pleural fluid. Hepatobiliary: Diffuse low density of the liver relative to the spleen. Cholecystectomy clips. Pancreas: Unremarkable. No pancreatic ductal dilatation or surrounding inflammatory changes. Spleen: Normal in size without focal abnormality. Adrenals/Urinary Tract: Adrenal glands are unremarkable. Kidneys are normal, without renal calculi, focal lesion, or hydronephrosis. Bladder is unremarkable. Stomach/Bowel: Dilated stomach with reflux contrast in the distal esophagus. Multiple dilated loops of proximal jejunum with normal caliber distal small bowel. The transition to normal caliber small bowel appears to be gradual in the left  lower abdomen without a discrete transition point identified. No bowel wall thickening or pneumatosis seen. Mild swirling of the small bowel mesentery in the mid abdomen. Multiple colonic diverticula. Normal appearing appendix. Vascular/Lymphatic: Atheromatous arterial calcifications, including the abdominal aorta, without aneurysm. No enlarged lymph nodes. Reproductive: Moderately enlarged prostate gland. This is protruding into the base of the urinary bladder. Other: No hernia, free peritoneal fluid or free peritoneal air seen. Musculoskeletal: Lumbar and lower thoracic spine degenerative changes. Bilateral hip degenerative changes, greater on the right. IMPRESSION: 1. Partial small bowel obstruction with no cause of obstruction identified. 2. Patchy airspace opacity in the medial aspect of the lingula, suspicious for pneumonia. 3. Diffuse hepatic steatosis. 4. Mild aortic atherosclerosis. 5. Moderately enlarged prostate gland. Electronically Signed   By: Claudie Revering M.D.   On: 02/03/2017 17:13   Dg Abd Portable 1 View  Result Date: 02/03/2017 CLINICAL DATA:  Ng tube placement EXAM: PORTABLE ABDOMEN - 1 VIEW COMPARISON:  CT abdomen pelvis - 02/03/2017 FINDINGS: Enteric tube tip and side port project over the expected location of the gastric antrum. Re- demonstrated moderate gas distention of multiple loops of small bowel with index loop of small bowel within the mid abdomen measuring approximately 3.1 cm in diameter. This findings again associated with a paucity of distal colonic gas. Nondiagnostic evaluation for pneumoperitoneum secondary supine positioning and exclusion of the lower thorax. No pneumatosis or portal venous gas. Stigmata of DISH within the lower thoracic spine. IMPRESSION: 1. Enteric tube tip and side port projected the expected location of the gastric antrum. 2. Similar findings worrisome for partial small bowel obstruction. Electronically Signed   By: Sandi Mariscal M.D.   On: 02/03/2017 18:59      Management plans discussed with the patient, family and they are in agreement.  CODE STATUS:     Code Status Orders        Start     Ordered   02/03/17 2257  Full code  Continuous     02/03/17 2256    Code Status History    Date Active Date Inactive Code Status Order ID Comments User Context   This patient has a current code status but no historical code status.    Advance Directive Documentation     Most Recent Value  Type of Advance Directive  Healthcare Power of Attorney, Living will  Pre-existing out of facility DNR order (yellow form or pink MOST form)  -  "MOST" Form in Place?  -      TOTAL TIME TAKING CARE OF THIS PATIENT: 40* minutes.    Ridge Lafond M.D on 02/04/2017 at 2:05 PM  Between 7am to 6pm - Pager - [phone] After 6pm go to [email or web address] - password [email or web address]  [employer]  [phone]  CC: Primary care physician; No PCP Per Patient

## 2017-02-04 NOTE — Progress Notes (Signed)
Dr Dahlia Byes gave orders to remove NG tube and start patient on a clear liquid diet

## 2017-02-04 NOTE — Discharge Instructions (Signed)
Find PCP in the area and f/u as needed

## 2017-02-04 NOTE — Progress Notes (Signed)
Ileus versus enteritis No evidence of bowel obstruction KUB reviewed showing gas in the colon. She had passed gas and having bowel movements AVSS  PE NAD Abd: soft, NT, no peritonitis  A/P Resolving ileus DC NGT Clears No surgery We will be available

## 2017-02-13 ENCOUNTER — Emergency Department
Admission: EM | Admit: 2017-02-13 | Discharge: 2017-02-14 | Disposition: A | Discharge status: Home / Self Care | Payer: Medicare Other | Attending: Emergency Medicine | Admitting: Emergency Medicine

## 2017-02-13 ENCOUNTER — Encounter: Payer: Self-pay | Admitting: Emergency Medicine

## 2017-02-13 DIAGNOSIS — I10 Essential (primary) hypertension: Secondary | ICD-10-CM | POA: Diagnosis not present

## 2017-02-13 DIAGNOSIS — Z7982 Long term (current) use of aspirin: Secondary | ICD-10-CM | POA: Diagnosis not present

## 2017-02-13 DIAGNOSIS — Z79899 Other long term (current) drug therapy: Secondary | ICD-10-CM | POA: Diagnosis not present

## 2017-02-13 DIAGNOSIS — K859 Acute pancreatitis without necrosis or infection, unspecified: Secondary | ICD-10-CM | POA: Insufficient documentation

## 2017-02-13 DIAGNOSIS — R109 Unspecified abdominal pain: Secondary | ICD-10-CM | POA: Diagnosis present

## 2017-02-13 NOTE — ED Notes (Signed)
Pt ambulatory with steady gait to treatment room 11; declined wheelchair; report given to Kandee Keen, Therapist, sports, pt's primary RN

## 2017-02-13 NOTE — ED Provider Notes (Signed)
[facility] Emergency Department Provider Note  ____________________________________________   I have reviewed the triage vital signs and the nursing notes.   HISTORY  Chief Complaint Abdominal Pain   History limited by: Not Limited   HPI Logan Murphy is a 71 y.o. male who presents to the emergency department today because of concerns for abdominal pain and distention. These symptoms started this afternoon. They have been accompanied by nausea. The patient was discharged from the hospital about 10 days ago after an admission for an illeus. Patient states that today's symptoms are the same as they were when he was seen previously. He denies any vomiting today however. No fevers. Last BM was this afternoon. Has not been passing gas since.    Past Medical History:  Diagnosis Date  . Gout   . Hypertension   . Thyroid disease     Patient Active Problem List   Diagnosis Date Noted  . Ileus ([place]) 02/03/2017  . Enteritis     History reviewed. No pertinent surgical history.  Prior to Admission medications   Medication Sig Start Date End Date Taking? Authorizing Provider  allopurinol (ZYLOPRIM) 300 MG tablet Take 300 mg by mouth daily.   Yes Historical Provider, MD  aspirin EC 81 MG tablet Take 81 mg by mouth daily.   Yes Historical Provider, MD  Cholecalciferol 400 units CAPS Take 400 Units by mouth daily.    Yes Historical Provider, MD  levothyroxine (SYNTHROID, LEVOTHROID) 75 MCG tablet Take 75 mcg by mouth daily.   Yes Historical Provider, MD  lisinopril-hydrochlorothiazide (PRINZIDE,ZESTORETIC) 10-12.5 MG tablet Take 1 tablet by mouth daily.   Yes Historical Provider, MD    Allergies Patient has no known allergies.  History reviewed. No pertinent family history.  Social History Social History  Substance Use Topics  . Smoking status: Never Smoker  . Smokeless tobacco: Never Used  . Alcohol use Yes    Review of Systems  Constitutional:  Negative for fever. Cardiovascular: Negative for chest pain. Respiratory: Negative for shortness of breath. Gastrointestinal: Positive for abdominal pain and distention. Positive for nausea.  Genitourinary: Negative for dysuria. Musculoskeletal: Negative for back pain. Skin: Negative for rash. Neurological: Negative for headaches, focal weakness or numbness.  10-point ROS otherwise negative.  ____________________________________________   PHYSICAL EXAM:  VITAL SIGNS: ED Triage Vitals [02/13/17 2324]  Enc Vitals Group     BP (!) 164/83     Pulse Rate 66     Resp 18     Temp 99 F (37.2 C)     Temp Source Oral     SpO2 97 %     Weight 230 lb (104.3 kg)     Height 6\' 1"  (1.854 m)     Head Circumference      Peak Flow      Pain Score 8     Pain Loc     Constitutional: Alert and oriented. Well appearing and in no distress. Eyes: Conjunctivae are normal. Normal extraocular movements. ENT   Head: Normocephalic and atraumatic.   Nose: No congestion/rhinnorhea.   Mouth/Throat: Mucous membranes are moist.   Neck: No stridor. Hematological/Lymphatic/Immunilogical: No cervical lymphadenopathy. Cardiovascular: Normal rate, regular rhythm.  No murmurs, rubs, or gallops. Respiratory: Normal respiratory effort without tachypnea nor retractions. Breath sounds are clear and equal bilaterally. No wheezes/rales/rhonchi. Gastrointestinal: Soft and non tender. No rebound. No guarding.  Genitourinary: Deferred Musculoskeletal: Normal range of motion in all extremities. No lower extremity edema. Neurologic:  Normal speech and  language. No gross focal neurologic deficits are appreciated.  Skin:  Skin is warm, dry and intact. No rash noted. Psychiatric: Mood and affect are normal. Speech and behavior are normal. Patient exhibits appropriate insight and judgment.  ____________________________________________    LABS (pertinent positives/negatives)  Labs Reviewed  LIPASE,  BLOOD - Abnormal; Notable for the following:       Result Value   Lipase 57 (*)    All other components within normal limits  COMPREHENSIVE METABOLIC PANEL - Abnormal; Notable for the following:    Glucose, Bld 113 (*)    Calcium 8.6 (*)    All other components within normal limits  URINALYSIS, COMPLETE (UACMP) WITH MICROSCOPIC - Abnormal; Notable for the following:    Color, Urine YELLOW (*)    APPearance CLEAR (*)    All other components within normal limits  CBC  TROPONIN I     ____________________________________________   EKG  I, Nance Pear, attending physician, personally viewed and interpreted this EKG  EKG Time: 2348 Rate: 66 Rhythm: normal sinus  Axis: left axis deviation Intervals: qtc 434 QRS: narrow ST changes: no st elevation Impression: abnormal ekg   ____________________________________________    RADIOLOGY  CT abd/pel IMPRESSION: 1. Mild soft tissue inflammation at the head of the pancreas raises concern for very mild pancreatitis. Would correlate with pancreatic lab values. 2. Mild hazy opacity at the left lingula raises concern for mild pneumonia. 3. Scattered aortic atherosclerosis. 4. Significantly enlarged prostate, with impression on the base of the bladder. Would correlate with PSA. 5. Scattered tiny bilateral renal stones seen. Mild scarring at the left kidney. 6. Scattered diverticulosis along the distal transverse, descending and proximal sigmoid colon, without evidence of diverticulitis.   ____________________________________________   PROCEDURES  Procedures  ____________________________________________   INITIAL IMPRESSION / ASSESSMENT AND PLAN / ED COURSE  Pertinent labs & imaging results that were available during my care of the patient were reviewed by me and considered in my medical decision making (see chart for details).  Patient presented to the emergency department today because of concerns for abdominal  pain and distention. Patient recently had an admission for an ileus. Today however CT scan showed findings consistent with pancreatitis. Additionally the lipase was minimally elevated. During the patient's stay here in the emergency department he stated that the distention did improve and his pain did improve. This point I think pancreatitis likely. Unclear etiology. Did discuss with patient restarting clear diet. Will give patient pain medication and nausea medication. Patient felt comfortable trying to manage at home.  ____________________________________________   FINAL CLINICAL IMPRESSION(S) / ED DIAGNOSES  Final diagnoses:  Acute pancreatitis, unspecified complication status, unspecified pancreatitis type     Note: This dictation was prepared with Dragon dictation. Any transcriptional errors that result from this process are unintentional     Nance Pear, MD 02/14/17 [phone]

## 2017-02-13 NOTE — ED Triage Notes (Signed)
Pt seen here 10 days ago and admitted overnight for small bowel obstruction; resolved on it's own after 1 day admission; pt here tonight with exact same symptoms as that visit; epigastric pain; nausea, no vomiting; not passing any gas; decreased appetite; very small BM today;

## 2017-02-14 ENCOUNTER — Emergency Department: Payer: Medicare Other

## 2017-02-14 DIAGNOSIS — K859 Acute pancreatitis without necrosis or infection, unspecified: Secondary | ICD-10-CM | POA: Diagnosis not present

## 2017-02-14 LAB — COMPREHENSIVE METABOLIC PANEL
ALK PHOS: 51 U/L (ref 38–126)
ALT: 24 U/L (ref 17–63)
ANION GAP: 6 (ref 5–15)
AST: 24 U/L (ref 15–41)
Albumin: 3.7 g/dL (ref 3.5–5.0)
BUN: 14 mg/dL (ref 6–20)
CALCIUM: 8.6 mg/dL — AB (ref 8.9–10.3)
CO2: 26 mmol/L (ref 22–32)
Chloride: 103 mmol/L (ref 101–111)
Creatinine, Ser: 0.88 mg/dL (ref 0.61–1.24)
Glucose, Bld: 113 mg/dL — ABNORMAL HIGH (ref 65–99)
Potassium: 3.8 mmol/L (ref 3.5–5.1)
Sodium: 135 mmol/L (ref 135–145)
Total Bilirubin: 1 mg/dL (ref 0.3–1.2)
Total Protein: 7.1 g/dL (ref 6.5–8.1)

## 2017-02-14 LAB — CBC
HCT: 42.6 % (ref 40.0–52.0)
HEMOGLOBIN: 14.6 g/dL (ref 13.0–18.0)
MCH: 31.2 pg (ref 26.0–34.0)
MCHC: 34.2 g/dL (ref 32.0–36.0)
MCV: 91.3 fL (ref 80.0–100.0)
PLATELETS: 194 10*3/uL (ref 150–440)
RBC: 4.67 MIL/uL (ref 4.40–5.90)
RDW: 14.5 % (ref 11.5–14.5)
WBC: 9.9 10*3/uL (ref 3.8–10.6)

## 2017-02-14 LAB — URINALYSIS, COMPLETE (UACMP) WITH MICROSCOPIC
BACTERIA UA: NONE SEEN
BILIRUBIN URINE: NEGATIVE
Glucose, UA: NEGATIVE mg/dL
Hgb urine dipstick: NEGATIVE
Ketones, ur: NEGATIVE mg/dL
LEUKOCYTES UA: NEGATIVE
NITRITE: NEGATIVE
PROTEIN: NEGATIVE mg/dL
SPECIFIC GRAVITY, URINE: 1.015 (ref 1.005–1.030)
SQUAMOUS EPITHELIAL / LPF: NONE SEEN
WBC, UA: NONE SEEN WBC/hpf (ref 0–5)
pH: 6 (ref 5.0–8.0)

## 2017-02-14 LAB — LIPASE, BLOOD: LIPASE: 57 U/L — AB (ref 11–51)

## 2017-02-14 LAB — TROPONIN I

## 2017-02-14 MED ORDER — TRAMADOL HCL 50 MG PO TABS: 50.0000 mg | ORAL_TABLET | Freq: Four times a day (QID) | ORAL | 0 refills | Status: DC | PRN

## 2017-02-14 MED ORDER — IOPAMIDOL (ISOVUE-300) INJECTION 61%
100.0000 mL | Freq: Once | INTRAVENOUS | Status: AC | PRN
  Administered 2017-02-14: 100 mL via INTRAVENOUS

## 2017-02-14 MED ORDER — ONDANSETRON HCL 4 MG PO TABS: 4.0000 mg | ORAL_TABLET | Freq: Three times a day (TID) | ORAL | 0 refills | Status: DC | PRN

## 2017-02-14 MED ORDER — IOPAMIDOL (ISOVUE-300) INJECTION 61%
30.0000 mL | Freq: Once | INTRAVENOUS | Status: AC | PRN
  Administered 2017-02-14: 30 mL via ORAL

## 2017-02-14 NOTE — Discharge Instructions (Signed)
Please seek medical attention for any high fevers, chest pain, shortness of breath, change in behavior, persistent vomiting, bloody stool or any other new or concerning symptoms.  

## 2017-03-03 DIAGNOSIS — R972 Elevated prostate specific antigen [PSA]: Secondary | ICD-10-CM | POA: Insufficient documentation

## 2017-03-03 DIAGNOSIS — I10 Essential (primary) hypertension: Secondary | ICD-10-CM | POA: Insufficient documentation

## 2017-03-03 DIAGNOSIS — E039 Hypothyroidism, unspecified: Secondary | ICD-10-CM | POA: Insufficient documentation

## 2017-03-03 DIAGNOSIS — M1611 Unilateral primary osteoarthritis, right hip: Secondary | ICD-10-CM | POA: Insufficient documentation

## 2017-05-06 ENCOUNTER — Other Ambulatory Visit: Payer: Medicare Other

## 2017-05-13 ENCOUNTER — Other Ambulatory Visit: Payer: Medicare Other

## 2017-05-26 ENCOUNTER — Inpatient Hospital Stay: Admission: RE | Admit: 2017-05-26 | Payer: Medicare Other | Source: Ambulatory Visit | Admitting: Orthopedic Surgery

## 2017-05-26 ENCOUNTER — Encounter: Admission: RE | Payer: Self-pay | Source: Ambulatory Visit

## 2017-05-26 SURGERY — ARTHROPLASTY, HIP, TOTAL, ANTERIOR APPROACH
Anesthesia: Choice | Laterality: Right

## 2017-09-28 ENCOUNTER — Ambulatory Visit
Admission: RE | Admit: 2017-09-28 | Discharge: 2017-09-28 | Disposition: A | Discharge status: Home / Self Care | Payer: Medicare Other | Source: Ambulatory Visit | Attending: Internal Medicine | Admitting: Internal Medicine

## 2017-09-28 ENCOUNTER — Other Ambulatory Visit: Payer: Self-pay | Admitting: Internal Medicine

## 2017-09-28 DIAGNOSIS — M791 Myalgia, unspecified site: Secondary | ICD-10-CM

## 2017-09-28 DIAGNOSIS — R600 Localized edema: Secondary | ICD-10-CM | POA: Insufficient documentation

## 2017-09-28 DIAGNOSIS — I2510 Atherosclerotic heart disease of native coronary artery without angina pectoris: Secondary | ICD-10-CM | POA: Insufficient documentation

## 2017-09-28 DIAGNOSIS — R5383 Other fatigue: Secondary | ICD-10-CM | POA: Insufficient documentation

## 2017-09-28 DIAGNOSIS — R5381 Other malaise: Secondary | ICD-10-CM | POA: Diagnosis present

## 2017-09-28 DIAGNOSIS — R062 Wheezing: Secondary | ICD-10-CM | POA: Insufficient documentation

## 2017-09-28 DIAGNOSIS — I700 Atherosclerosis of aorta: Secondary | ICD-10-CM | POA: Insufficient documentation

## 2017-09-28 DIAGNOSIS — K760 Fatty (change of) liver, not elsewhere classified: Secondary | ICD-10-CM | POA: Diagnosis not present

## 2017-09-28 DIAGNOSIS — I712 Thoracic aortic aneurysm, without rupture: Secondary | ICD-10-CM | POA: Diagnosis not present

## 2017-09-28 DIAGNOSIS — R918 Other nonspecific abnormal finding of lung field: Secondary | ICD-10-CM | POA: Diagnosis not present

## 2017-09-28 DIAGNOSIS — Z96641 Presence of right artificial hip joint: Secondary | ICD-10-CM | POA: Diagnosis not present

## 2017-09-28 MED ORDER — IOPAMIDOL (ISOVUE-370) INJECTION 76%
75.0000 mL | Freq: Once | INTRAVENOUS | Status: AC | PRN
  Administered 2017-09-28: 75 mL via INTRAVENOUS

## 2017-10-27 DIAGNOSIS — I2584 Coronary atherosclerosis due to calcified coronary lesion: Secondary | ICD-10-CM | POA: Insufficient documentation

## 2017-10-27 DIAGNOSIS — I2510 Atherosclerotic heart disease of native coronary artery without angina pectoris: Secondary | ICD-10-CM | POA: Insufficient documentation

## 2017-10-27 DIAGNOSIS — I729 Aneurysm of unspecified site: Secondary | ICD-10-CM | POA: Insufficient documentation

## 2017-10-27 DIAGNOSIS — I700 Atherosclerosis of aorta: Secondary | ICD-10-CM | POA: Insufficient documentation

## 2017-11-04 DIAGNOSIS — I712 Thoracic aortic aneurysm, without rupture: Secondary | ICD-10-CM | POA: Insufficient documentation

## 2017-11-04 DIAGNOSIS — I2510 Atherosclerotic heart disease of native coronary artery without angina pectoris: Secondary | ICD-10-CM | POA: Insufficient documentation

## 2017-11-04 DIAGNOSIS — I7121 Aneurysm of the ascending aorta, without rupture: Secondary | ICD-10-CM | POA: Insufficient documentation

## 2017-11-16 ENCOUNTER — Other Ambulatory Visit: Payer: Self-pay | Admitting: Internal Medicine

## 2017-11-16 DIAGNOSIS — R053 Chronic cough: Secondary | ICD-10-CM

## 2017-11-16 DIAGNOSIS — R05 Cough: Secondary | ICD-10-CM

## 2017-11-16 DIAGNOSIS — Z8701 Personal history of pneumonia (recurrent): Secondary | ICD-10-CM

## 2017-11-23 ENCOUNTER — Ambulatory Visit
Admission: RE | Admit: 2017-11-23 | Discharge: 2017-11-23 | Disposition: A | Discharge status: Home / Self Care | Payer: Medicare Other | Source: Ambulatory Visit | Attending: Internal Medicine | Admitting: Internal Medicine

## 2017-11-23 DIAGNOSIS — Z8701 Personal history of pneumonia (recurrent): Secondary | ICD-10-CM | POA: Insufficient documentation

## 2017-11-23 DIAGNOSIS — Z9049 Acquired absence of other specified parts of digestive tract: Secondary | ICD-10-CM | POA: Insufficient documentation

## 2017-11-23 DIAGNOSIS — I700 Atherosclerosis of aorta: Secondary | ICD-10-CM | POA: Diagnosis not present

## 2017-11-23 DIAGNOSIS — I252 Old myocardial infarction: Secondary | ICD-10-CM | POA: Diagnosis not present

## 2017-11-23 DIAGNOSIS — R05 Cough: Secondary | ICD-10-CM

## 2017-11-23 DIAGNOSIS — I2510 Atherosclerotic heart disease of native coronary artery without angina pectoris: Secondary | ICD-10-CM | POA: Insufficient documentation

## 2017-11-23 DIAGNOSIS — R053 Chronic cough: Secondary | ICD-10-CM

## 2017-11-23 MED ORDER — IOPAMIDOL (ISOVUE-300) INJECTION 61%
75.0000 mL | Freq: Once | INTRAVENOUS | Status: AC | PRN
  Administered 2017-11-23: 75 mL via INTRAVENOUS

## 2017-11-26 DIAGNOSIS — R768 Other specified abnormal immunological findings in serum: Secondary | ICD-10-CM | POA: Insufficient documentation

## 2017-12-31 DIAGNOSIS — R0600 Dyspnea, unspecified: Secondary | ICD-10-CM | POA: Insufficient documentation

## 2017-12-31 DIAGNOSIS — R5383 Other fatigue: Secondary | ICD-10-CM | POA: Insufficient documentation

## 2017-12-31 DIAGNOSIS — R0689 Other abnormalities of breathing: Secondary | ICD-10-CM | POA: Insufficient documentation

## 2017-12-31 DIAGNOSIS — M791 Myalgia, unspecified site: Secondary | ICD-10-CM | POA: Insufficient documentation

## 2018-01-07 DIAGNOSIS — R768 Other specified abnormal immunological findings in serum: Secondary | ICD-10-CM | POA: Insufficient documentation

## 2018-01-07 DIAGNOSIS — J159 Unspecified bacterial pneumonia: Secondary | ICD-10-CM | POA: Diagnosis present

## 2018-01-10 ENCOUNTER — Encounter: Payer: Self-pay | Admitting: Emergency Medicine

## 2018-01-10 ENCOUNTER — Emergency Department: Payer: Medicare Other

## 2018-01-10 DIAGNOSIS — G4730 Sleep apnea, unspecified: Secondary | ICD-10-CM | POA: Diagnosis present

## 2018-01-10 DIAGNOSIS — R3129 Other microscopic hematuria: Secondary | ICD-10-CM | POA: Diagnosis present

## 2018-01-10 DIAGNOSIS — J42 Unspecified chronic bronchitis: Principal | ICD-10-CM | POA: Diagnosis present

## 2018-01-10 DIAGNOSIS — E039 Hypothyroidism, unspecified: Secondary | ICD-10-CM | POA: Diagnosis present

## 2018-01-10 DIAGNOSIS — Z79899 Other long term (current) drug therapy: Secondary | ICD-10-CM

## 2018-01-10 DIAGNOSIS — Z8701 Personal history of pneumonia (recurrent): Secondary | ICD-10-CM

## 2018-01-10 DIAGNOSIS — E876 Hypokalemia: Secondary | ICD-10-CM | POA: Diagnosis present

## 2018-01-10 DIAGNOSIS — M109 Gout, unspecified: Secondary | ICD-10-CM | POA: Diagnosis present

## 2018-01-10 DIAGNOSIS — K760 Fatty (change of) liver, not elsewhere classified: Secondary | ICD-10-CM | POA: Diagnosis present

## 2018-01-10 DIAGNOSIS — M310 Hypersensitivity angiitis: Secondary | ICD-10-CM | POA: Diagnosis present

## 2018-01-10 DIAGNOSIS — E871 Hypo-osmolality and hyponatremia: Secondary | ICD-10-CM | POA: Diagnosis present

## 2018-01-10 DIAGNOSIS — N400 Enlarged prostate without lower urinary tract symptoms: Secondary | ICD-10-CM | POA: Diagnosis present

## 2018-01-10 DIAGNOSIS — J189 Pneumonia, unspecified organism: Secondary | ICD-10-CM | POA: Diagnosis not present

## 2018-01-10 DIAGNOSIS — I776 Arteritis, unspecified: Secondary | ICD-10-CM | POA: Diagnosis present

## 2018-01-10 DIAGNOSIS — Z96641 Presence of right artificial hip joint: Secondary | ICD-10-CM | POA: Diagnosis present

## 2018-01-10 DIAGNOSIS — M064 Inflammatory polyarthropathy: Secondary | ICD-10-CM | POA: Diagnosis present

## 2018-01-10 DIAGNOSIS — I10 Essential (primary) hypertension: Secondary | ICD-10-CM | POA: Diagnosis present

## 2018-01-10 DIAGNOSIS — Y95 Nosocomial condition: Secondary | ICD-10-CM | POA: Diagnosis present

## 2018-01-10 DIAGNOSIS — Z7982 Long term (current) use of aspirin: Secondary | ICD-10-CM

## 2018-01-10 NOTE — ED Triage Notes (Signed)
Patient has been treated twice for pneumonia and is currently on Levaquin (day 6 of 7) but today went to the movies and was sitting in recliner and was eating a junior mint and possibly aspirated on it while he was reclined.  He says he has had increased coughing since that happened.  They were not able to stay and see the movie because he was coughing so much.  Patient in NAD at this time and is 94% on RA.

## 2018-01-11 ENCOUNTER — Emergency Department: Payer: Medicare Other

## 2018-01-11 ENCOUNTER — Inpatient Hospital Stay
Admission: EM | Admit: 2018-01-11 | Discharge: 2018-01-14 | DRG: 167 | Disposition: A | Discharge status: Home / Self Care | Payer: Medicare Other | Attending: Internal Medicine | Admitting: Internal Medicine

## 2018-01-11 ENCOUNTER — Other Ambulatory Visit: Payer: Self-pay

## 2018-01-11 DIAGNOSIS — R3129 Other microscopic hematuria: Secondary | ICD-10-CM | POA: Diagnosis present

## 2018-01-11 DIAGNOSIS — K760 Fatty (change of) liver, not elsewhere classified: Secondary | ICD-10-CM | POA: Diagnosis present

## 2018-01-11 DIAGNOSIS — J189 Pneumonia, unspecified organism: Secondary | ICD-10-CM | POA: Diagnosis present

## 2018-01-11 DIAGNOSIS — N400 Enlarged prostate without lower urinary tract symptoms: Secondary | ICD-10-CM | POA: Diagnosis present

## 2018-01-11 DIAGNOSIS — E871 Hypo-osmolality and hyponatremia: Secondary | ICD-10-CM | POA: Diagnosis present

## 2018-01-11 DIAGNOSIS — I10 Essential (primary) hypertension: Secondary | ICD-10-CM | POA: Diagnosis present

## 2018-01-11 DIAGNOSIS — J159 Unspecified bacterial pneumonia: Secondary | ICD-10-CM | POA: Diagnosis present

## 2018-01-11 DIAGNOSIS — R0600 Dyspnea, unspecified: Secondary | ICD-10-CM

## 2018-01-11 DIAGNOSIS — Z96641 Presence of right artificial hip joint: Secondary | ICD-10-CM | POA: Diagnosis present

## 2018-01-11 DIAGNOSIS — Z79899 Other long term (current) drug therapy: Secondary | ICD-10-CM | POA: Diagnosis not present

## 2018-01-11 DIAGNOSIS — Z9889 Other specified postprocedural states: Secondary | ICD-10-CM

## 2018-01-11 DIAGNOSIS — G4730 Sleep apnea, unspecified: Secondary | ICD-10-CM | POA: Diagnosis present

## 2018-01-11 DIAGNOSIS — M310 Hypersensitivity angiitis: Secondary | ICD-10-CM | POA: Diagnosis present

## 2018-01-11 DIAGNOSIS — E876 Hypokalemia: Secondary | ICD-10-CM | POA: Diagnosis present

## 2018-01-11 DIAGNOSIS — Z7982 Long term (current) use of aspirin: Secondary | ICD-10-CM | POA: Diagnosis not present

## 2018-01-11 DIAGNOSIS — Y95 Nosocomial condition: Secondary | ICD-10-CM | POA: Diagnosis present

## 2018-01-11 DIAGNOSIS — J42 Unspecified chronic bronchitis: Secondary | ICD-10-CM | POA: Diagnosis present

## 2018-01-11 DIAGNOSIS — E039 Hypothyroidism, unspecified: Secondary | ICD-10-CM | POA: Diagnosis present

## 2018-01-11 DIAGNOSIS — Z8701 Personal history of pneumonia (recurrent): Secondary | ICD-10-CM | POA: Diagnosis not present

## 2018-01-11 DIAGNOSIS — M109 Gout, unspecified: Secondary | ICD-10-CM | POA: Diagnosis present

## 2018-01-11 DIAGNOSIS — M064 Inflammatory polyarthropathy: Secondary | ICD-10-CM | POA: Diagnosis present

## 2018-01-11 DIAGNOSIS — I776 Arteritis, unspecified: Secondary | ICD-10-CM | POA: Diagnosis present

## 2018-01-11 DIAGNOSIS — J8489 Other specified interstitial pulmonary diseases: Secondary | ICD-10-CM | POA: Diagnosis not present

## 2018-01-11 LAB — CBC WITH DIFFERENTIAL/PLATELET
Basophils Absolute: 0 10*3/uL (ref 0–0.1)
Basophils Relative: 0 %
EOS ABS: 0 10*3/uL (ref 0–0.7)
Eosinophils Relative: 0 %
HEMATOCRIT: 37.6 % — AB (ref 40.0–52.0)
HEMOGLOBIN: 12.5 g/dL — AB (ref 13.0–18.0)
LYMPHS ABS: 0.6 10*3/uL — AB (ref 1.0–3.6)
Lymphocytes Relative: 5 %
MCH: 29 pg (ref 26.0–34.0)
MCHC: 33.2 g/dL (ref 32.0–36.0)
MCV: 87.3 fL (ref 80.0–100.0)
Monocytes Absolute: 0.6 10*3/uL (ref 0.2–1.0)
Monocytes Relative: 4 %
NEUTROS ABS: 12 10*3/uL — AB (ref 1.4–6.5)
NEUTROS PCT: 91 %
Platelets: 257 10*3/uL (ref 150–440)
RBC: 4.31 MIL/uL — AB (ref 4.40–5.90)
RDW: 14.8 % — ABNORMAL HIGH (ref 11.5–14.5)
WBC: 13.3 10*3/uL — AB (ref 3.8–10.6)

## 2018-01-11 LAB — TROPONIN I: Troponin I: <0.03 ng/mL (ref <0.03)

## 2018-01-11 LAB — RESPIRATORY PANEL BY PCR
ADENOVIRUS-RVPPCR: NOT DETECTED
Bordetella pertussis: NOT DETECTED
CORONAVIRUS HKU1-RVPPCR: NOT DETECTED
CORONAVIRUS NL63-RVPPCR: NOT DETECTED
CORONAVIRUS OC43-RVPPCR: NOT DETECTED
Chlamydophila pneumoniae: NOT DETECTED
Coronavirus 229E: NOT DETECTED
Influenza A: NOT DETECTED
Influenza B: NOT DETECTED
METAPNEUMOVIRUS-RVPPCR: NOT DETECTED
Mycoplasma pneumoniae: NOT DETECTED
PARAINFLUENZA VIRUS 1-RVPPCR: NOT DETECTED
PARAINFLUENZA VIRUS 2-RVPPCR: NOT DETECTED
PARAINFLUENZA VIRUS 4-RVPPCR: NOT DETECTED
Parainfluenza Virus 3: NOT DETECTED
RHINOVIRUS / ENTEROVIRUS - RVPPCR: NOT DETECTED
Respiratory Syncytial Virus: NOT DETECTED

## 2018-01-11 LAB — INFLUENZA PANEL BY PCR (TYPE A & B)
INFLBPCR: NEGATIVE
Influenza A By PCR: NEGATIVE

## 2018-01-11 LAB — SEDIMENTATION RATE: Sed Rate: 52 mm/hr — ABNORMAL HIGH (ref 0–20)

## 2018-01-11 LAB — URINALYSIS, COMPLETE (UACMP) WITH MICROSCOPIC
Bacteria, UA: NONE SEEN
Bilirubin Urine: NEGATIVE
GLUCOSE, UA: NEGATIVE mg/dL
Ketones, ur: NEGATIVE mg/dL
Leukocytes, UA: NEGATIVE
Nitrite: NEGATIVE
PH: 5 (ref 5.0–8.0)
Protein, ur: 30 mg/dL — AB
SPECIFIC GRAVITY, URINE: 1.023 (ref 1.005–1.030)

## 2018-01-11 LAB — BRAIN NATRIURETIC PEPTIDE: B Natriuretic Peptide: 35 pg/mL (ref 0.0–100.0)

## 2018-01-11 LAB — COMPREHENSIVE METABOLIC PANEL
ALBUMIN: 3.6 g/dL (ref 3.5–5.0)
ALT: 26 U/L (ref 17–63)
AST: 29 U/L (ref 15–41)
Alkaline Phosphatase: 47 U/L (ref 38–126)
Anion gap: 9 (ref 5–15)
BUN: 23 mg/dL — AB (ref 6–20)
CHLORIDE: 96 mmol/L — AB (ref 101–111)
CO2: 26 mmol/L (ref 22–32)
CREATININE: 1.12 mg/dL (ref 0.61–1.24)
Calcium: 8.8 mg/dL — ABNORMAL LOW (ref 8.9–10.3)
GFR calc Af Amer: >60 mL/min (ref >60)
GLUCOSE: 124 mg/dL — AB (ref 65–99)
POTASSIUM: 4.1 mmol/L (ref 3.5–5.1)
SODIUM: 131 mmol/L — AB (ref 135–145)
Total Bilirubin: 1 mg/dL (ref 0.3–1.2)
Total Protein: 7.5 g/dL (ref 6.5–8.1)

## 2018-01-11 LAB — MRSA PCR SCREENING: MRSA by PCR: NEGATIVE

## 2018-01-11 LAB — CK: CK TOTAL: 112 U/L (ref 49–397)

## 2018-01-11 LAB — LIPASE, BLOOD: LIPASE: 34 U/L (ref 11–51)

## 2018-01-11 LAB — TSH: TSH: 4.895 u[IU]/mL — ABNORMAL HIGH (ref 0.350–4.500)

## 2018-01-11 LAB — LACTIC ACID, PLASMA: LACTIC ACID, VENOUS: 1.3 mmol/L (ref 0.5–1.9)

## 2018-01-11 LAB — C-REACTIVE PROTEIN: CRP: 9.2 mg/dL — ABNORMAL HIGH (ref <1.0)

## 2018-01-11 LAB — PROCALCITONIN: Procalcitonin: 0.18 ng/mL

## 2018-01-11 MED ORDER — ONDANSETRON HCL 4 MG/2ML IJ SOLN: 4.0000 mg | Freq: Four times a day (QID) | INTRAMUSCULAR | Status: DC | PRN

## 2018-01-11 MED ORDER — LEVOTHYROXINE SODIUM 50 MCG PO TABS
75.0000 ug | ORAL_TABLET | Freq: Every day | ORAL | Status: DC
  Administered 2018-01-11 – 2018-01-14 (×3): 75 ug via ORAL
  Filled 2018-01-11 (×3): qty 1

## 2018-01-11 MED ORDER — SODIUM CHLORIDE 0.9 % IV SOLN
2.0000 g | Freq: Three times a day (TID) | INTRAVENOUS | Status: DC
  Administered 2018-01-11 – 2018-01-12 (×3): 2 g via INTRAVENOUS
  Filled 2018-01-11 (×5): qty 2

## 2018-01-11 MED ORDER — HYDROCHLOROTHIAZIDE 12.5 MG PO CAPS
12.5000 mg | ORAL_CAPSULE | Freq: Every day | ORAL | Status: DC
  Administered 2018-01-11: 12.5 mg via ORAL
  Filled 2018-01-11 (×2): qty 1

## 2018-01-11 MED ORDER — ALLOPURINOL 300 MG PO TABS
300.0000 mg | ORAL_TABLET | Freq: Every day | ORAL | Status: DC
  Administered 2018-01-11 – 2018-01-14 (×3): 300 mg via ORAL
  Filled 2018-01-11 (×4): qty 1

## 2018-01-11 MED ORDER — TAMSULOSIN HCL 0.4 MG PO CAPS
0.4000 mg | ORAL_CAPSULE | Freq: Every day | ORAL | Status: DC
  Administered 2018-01-11 – 2018-01-13 (×3): 0.4 mg via ORAL
  Filled 2018-01-11 (×4): qty 1

## 2018-01-11 MED ORDER — PREMIER PROTEIN SHAKE
11.0000 [oz_av] | ORAL | Status: DC
  Administered 2018-01-12 – 2018-01-14 (×2): 11 [oz_av] via ORAL

## 2018-01-11 MED ORDER — CHOLECALCIFEROL 10 MCG (400 UNIT) PO CAPS: 400.0000 [IU] | ORAL_CAPSULE | Freq: Every day | ORAL | Status: DC

## 2018-01-11 MED ORDER — VANCOMYCIN HCL IN DEXTROSE 1-5 GM/200ML-% IV SOLN
1000.0000 mg | Freq: Once | INTRAVENOUS | Status: AC
  Administered 2018-01-11: 1000 mg via INTRAVENOUS
  Filled 2018-01-11: qty 200

## 2018-01-11 MED ORDER — TRAMADOL HCL 50 MG PO TABS
50.0000 mg | ORAL_TABLET | Freq: Four times a day (QID) | ORAL | Status: DC | PRN
  Administered 2018-01-11 (×2): 50 mg via ORAL
  Filled 2018-01-11 (×2): qty 1

## 2018-01-11 MED ORDER — VANCOMYCIN HCL 10 G IV SOLR
1250.0000 mg | Freq: Two times a day (BID) | INTRAVENOUS | Status: DC
  Administered 2018-01-11 (×2): 1250 mg via INTRAVENOUS
  Filled 2018-01-11 (×4): qty 1250

## 2018-01-11 MED ORDER — ACETAMINOPHEN 325 MG PO TABS
650.0000 mg | ORAL_TABLET | Freq: Four times a day (QID) | ORAL | Status: DC | PRN
  Administered 2018-01-11: 08:00:00 650 mg via ORAL
  Filled 2018-01-11: qty 2

## 2018-01-11 MED ORDER — ONDANSETRON HCL 4 MG PO TABS: 4.0000 mg | ORAL_TABLET | Freq: Four times a day (QID) | ORAL | Status: DC | PRN

## 2018-01-11 MED ORDER — SODIUM CHLORIDE 0.9 % IV BOLUS (SEPSIS)
1000.0000 mL | Freq: Once | INTRAVENOUS | Status: AC
  Administered 2018-01-11: 1000 mL via INTRAVENOUS

## 2018-01-11 MED ORDER — DOCUSATE SODIUM 100 MG PO CAPS
100.0000 mg | ORAL_CAPSULE | Freq: Two times a day (BID) | ORAL | Status: DC
  Administered 2018-01-11 – 2018-01-14 (×6): 100 mg via ORAL
  Filled 2018-01-11 (×6): qty 1

## 2018-01-11 MED ORDER — ACETAMINOPHEN 650 MG RE SUPP: 650.0000 mg | Freq: Four times a day (QID) | RECTAL | Status: DC | PRN

## 2018-01-11 MED ORDER — ASPIRIN EC 81 MG PO TBEC
81.0000 mg | DELAYED_RELEASE_TABLET | Freq: Every day | ORAL | Status: DC
  Administered 2018-01-11 – 2018-01-12 (×2): 81 mg via ORAL
  Filled 2018-01-11 (×2): qty 1

## 2018-01-11 MED ORDER — LISINOPRIL 20 MG PO TABS
40.0000 mg | ORAL_TABLET | Freq: Every day | ORAL | Status: DC
  Administered 2018-01-11 – 2018-01-14 (×3): 40 mg via ORAL
  Filled 2018-01-11 (×3): qty 2

## 2018-01-11 MED ORDER — ALBUTEROL SULFATE (2.5 MG/3ML) 0.083% IN NEBU: 2.5000 mg | INHALATION_SOLUTION | RESPIRATORY_TRACT | Status: DC | PRN

## 2018-01-11 MED ORDER — VITAMIN D 1000 UNITS PO TABS
500.0000 [IU] | ORAL_TABLET | Freq: Every day | ORAL | Status: DC
  Administered 2018-01-11 – 2018-01-14 (×3): 500 [IU] via ORAL
  Filled 2018-01-11 (×3): qty 1

## 2018-01-11 MED ORDER — CEFEPIME HCL 2 G IJ SOLR
2.0000 g | Freq: Once | INTRAMUSCULAR | Status: AC
  Administered 2018-01-11: 2 g via INTRAVENOUS
  Filled 2018-01-11: qty 2

## 2018-01-11 MED ORDER — ENOXAPARIN SODIUM 40 MG/0.4ML ~~LOC~~ SOLN
40.0000 mg | SUBCUTANEOUS | Status: DC
  Administered 2018-01-11: 21:00:00 40 mg via SUBCUTANEOUS
  Filled 2018-01-11: qty 0.4

## 2018-01-11 NOTE — Progress Notes (Addendum)
Date: 01/11/2018,   MRN# [id number] Logan Murphy July 07, 1946 Code Status:     Code Status Orders  (From admission, onward)        Start     Ordered   01/11/18 0543  Full code  Continuous     01/11/18 0542    Code Status History    Date Active Date Inactive Code Status Order ID Comments User Context   02/03/2017 22:56 02/04/2017 21:18 Full Code [id number]  Bettey Costa, MD Inpatient    Advance Directive Documentation     Most Recent Value  Type of Advance Directive  Healthcare Power of Attorney, Living will  Pre-existing out of facility DNR order (yellow form or pink MOST form)  No data  "MOST" Form in Place?  No data     Hosp day:@LENGTHOFSTAYDAYS @ Referring MD: @ATDPROV @       CC: Back here with bilateral infiltrates.   HPI: This is a 72 yr old male, chart and patient seen, patient seen, chart reviewed,  He has had at least three rounds of antibiotics, prednisone ( felt better on streroids) symptoms started in 10/18 on his trip to [place] and on the [place] and transatlantic cruise. Beside cough, myalgias and malaise there were peticihae on his legs. A doctor on the trip mentioned he probable has vasculitis.  Toady he has no fever. Multiple joint pains, and myalgias. No hemoptysis. No gerd or choking spells. He has seen rheum for elevated ra factor.  PMHX:   Past Medical History:  Diagnosis Date  . Gout   . Hypertension   . Thyroid disease    Surgical Hx:  Past Surgical History:  Procedure Laterality Date  . CHOLECYSTECTOMY    . TOTAL HIP ARTHROPLASTY Right    Family Hx:  Family History  Problem Relation Age of Onset  . Hypertension Other    Social Hx:   Social History   Tobacco Use  . Smoking status: Never Smoker  . Smokeless tobacco: Never Used  Substance Use Topics  . Alcohol use: Yes  . Drug use: No   Medication:    Home Medication:    Current Medication: @CURMEDTAB @   Allergies:  Patient has no known allergies.  Review of Systems: Gen:   Denies  fever, sweats, chills HEENT: Denies blurred vision, double vision, ear pain, eye pain, hearing loss, nose bleeds, sore throat Cvc:  No dizziness, chest pain or heaviness Resp:  No pleurisy, no hemoptysis, no wheezing Gi: Denies swallowing difficulty, stomach pain, nausea or vomiting, diarrhea, constipation, bowel incontinence Gu:  Denies bladder incontinence, burning urine Ext:   No Joint pain, stiffness or swelling Skin: No skin rash, easy bruising or bleeding or hives Endoc:  No polyuria, polydipsia , polyphagia or weight change Psych: No depression, insomnia or hallucinations  Other:  All other systems negative  Physical Examination:   VS: BP (!) 150/72 (BP Location: Left Arm)   Pulse 86   Temp 98.4 F (36.9 C) (Oral)   Resp 18   Ht 6\' 1"  (1.854 m)   Wt 109.6 kg (241 lb 10 oz)   SpO2 97%   BMI 31.88 kg/m   General Appearance: No distress  Neuro: without focal findings, mental status, speech normal, alert and oriented, cranial nerves 2-12 intact, reflexes normal and symmetric, sensation grossly normal  NECK. Thick, supple, nodes HEENT: PERRLA, EOM intact, no ptosis, no other lesions noticed, Mallampati: Pulmonary:.No wheezing, No rales     Cardiovascular:  Normal S1,S2.  No m/r/g.  Abdominal  aorta pulsation normal.    Abdomen:Benign, Soft, non-tender, No masses, hepatosplenomegaly, No lymphadenopathy Endoc: No evident thyromegaly, no signs of acromegaly or Cushing features Skin:   warm, no rashes, no ecchymosis  Extremities: normal, no cyanosis, clubbing, no edema, warm with normal capillary refill.    Labs results:  MRSA by PCR NEGATIVE NEGATIVE    Resp screen - ve    Recent Labs    01/11/18 0148  HGB 12.5*  HCT 37.6*  MCV 87.3  WBC 13.3*  BUN 23*  CREATININE 1.12  GLUCOSE 124*  CALCIUM 8.8*  ,  SPIROMETRY: FVC was 3.39 liters, 76% of predicted FEV1 was 2.80, 80% of predicted FEV1 ratio was 83 FEF 25-75% liters per second was 113% of  predicted  LUNG VOLUMES: TLC was 68% of predicted RV was 48% of predicted  DIFFUSION CAPACITY: DLCO was 70% of predicted DLCO/VA was 134% of predicted  FLOW VOLUME LOOP: Restrictive   Impression FVC is incomplete TLC is moderately decreased DLCO is mildly decreased but correct for VA. ? Extrathoracic restriction  Recommendation Clinical correlation Repeat study in 12 months      Rad results:  IMPRESSION: 1. No evidence of pulmonary embolism. 2. Findings, as above, most compatible with severe multilobar pneumonia. 3. Aortic atherosclerosis, in addition to left main and proximal left anterior descending coronary artery disease. Please note that although the presence of coronary artery calcium documents the presence of coronary artery disease, the severity of this disease and any potential stenosis cannot be assessed on this non-gated CT examination. Assessment for potential risk factor modification, dietary therapy or pharmacologic therapy may be warranted, if clinically indicated. 4. Mild aneurysmal dilatation of the ascending thoracic aorta (4.5 cm in diameter). Ascending thoracic aortic aneurysm. Recommend semi-annual imaging followup by CTA or MRA and referral to cardiothoracic surgery if not already obtained. This recommendation follows 2010 ACCF/AHA/AATS/ACR/ASA/SCA/SCAI/SIR/STS/SVM Guidelines for the Diagnosis and Management of Patients With Thoracic Aortic Disease. Circulation. 2010; 121: H371-I967. 5. Hepatic steatosis.  Aortic Atherosclerosis (ICD10-I70.0) and Aortic Aneurysm NOS (ICD10-I71.9).   Electronically Signed   By: Vinnie Langton M.D.   On: 09/28/2017 16:16  Dg Chest 2 View  Result Date: 01/10/2018 CLINICAL DATA:  Fever cough and shortness of breath. EXAM: CHEST  2 VIEW COMPARISON:  11/23/2017 chest CT. FINDINGS: RIGHT LOWER lobe airspace disease is compatible with pneumonia. Cardiomediastinal silhouette is unremarkable. No pleural  effusion or pneumothorax. No acute bony abnormalities identified. IMPRESSION: RIGHT LOWER lobe airspace disease compatible with pneumonia. Radiographic follow-up to resolution is recommended. Electronically Signed   By: Margarette Canada M.D.   On: 01/10/2018 23:30   Ct Chest Wo Contrast  Result Date: 01/11/2018 CLINICAL DATA:  72 year old male with acute respiratory illness. EXAM: CT CHEST WITHOUT CONTRAST TECHNIQUE: Multidetector CT imaging of the chest was performed following the standard protocol without IV contrast. COMPARISON:  Chest radiograph dated 01/10/2018 FINDINGS: Cardiovascular: There is no cardiomegaly or pericardial effusion. Mild atherosclerotic calcification of the thoracic aorta. The central pulmonary arteries are grossly unremarkable on this noncontrast CT. Mediastinum/Nodes: No hilar or mediastinal adenopathy. Esophagus is grossly unremarkable. No mediastinal fluid collection. Lungs/Pleura: Bilateral patchy ground-glass and nodular densities involving the lower lobes, right middle lobe, lingula and to a lesser degree the upper lobes most consistent with multifocal pneumonia. Clinical correlation and follow-up to resolution recommended. There is no pleural effusion or pneumothorax. The central airways are patent. Upper Abdomen: Cholecystectomy.  Mild fatty liver. Musculoskeletal: Degenerative changes of the spine. No acute osseous pathology. IMPRESSION: Multifocal  pneumonia. Electronically Signed   By: Anner Crete M.D.   On: 01/11/2018 03:10     Assessment and Plan: This is a pleasant 72 yr old male, non smoker,  who since 10/18 has been plaqued  With pneumonia like present, multiple admission, various rounds of antibiotics nad prednisone. He seem to think he felt on prednisone. At present I am leaning away from recurrent pneumonia, though part of the differential. He is looking more like a non infectious cause:?? Hypersensitivity pneumonitis ( mold and dust when they acquired to new  home), ?? Connective tissue disease/vasculitis. Does not look like pulmonary edema. -speech eval/swallow test -ana, sed rate, ra, ace, hypersensitivity pneumonitis, -continue present antibiotics for now -will add steroids -per above response will consider bronchoscopy    C/o increase fatigue ? Related to the above, ?? Sleep apnea vs other causes -check tsh, free t4, hiv -mono spot -home sleep study in the near future -following    I have personally obtained a history, examined the patient, evaluated laboratory and imaging results, formulated the assessment and plan and placed orders.  The Patient requires high complexity decision making for assessment and support, frequent evaluation and titration of therapies, application of advanced monitoring technologies and extensive interpretation of multiple databases.   Cindia Hustead,M.D. Board certified Pulmonary & Critical care Medicine [facility]

## 2018-01-11 NOTE — ED Notes (Signed)
Verbal report to Shanon Brow, RN

## 2018-01-11 NOTE — ED Notes (Signed)
Pt reports several months of recurring pneumonia; has been on 3 rounds of antibiotics and prednisone with 1 pill left of Levaquin in this 3rd round; pt says he was at the movies tonight and choked on a junior mint; began coughing and didn't stop for several hours; coughing has currently stopped but pt is concerned he has aspirated and has pneumonia again; denies chest pain; cough is nonproductive; denies fever; pt says he has seen multiple doctors for this recurring pneumonia and has an appointment with pulmonologist Dr Vella Kohler on Tuesday; pt says he used to be very active, golfing 4 times a week; now he sleeps 10 hours a night and takes naps; adds by the time he goes to bed he feels weak enough to use a walker; talking in complete coherent sentences

## 2018-01-11 NOTE — ED Provider Notes (Signed)
[facility] Emergency Department Provider Note  ____________________________________________   First MD Initiated Contact with Patient 01/11/18 [phone]     (approximate)  I have reviewed the triage vital signs and the nursing notes.   HISTORY  Chief Complaint Pneumonia    HPI Logan Murphy is a 72 y.o. male with a relatively recent complicated history of recurrent pneumonia (for the last 3 or 4 months) who is currently about to finish his third round of antibiotics who presents by private vehicle for evaluation of acute onset worsening of his cough.  He believes that he may have aspirated a piece of candy while watching a movie.  He has 1 dose of Levaquin remaining and has a first-time appointment with pulmonology in a little over 24 hours.  He states that he has no energy and has felt consistently ill for the last couple of months but that is not significantly worse recently, the thing that caused him to come in was because of the aspiration episode.  However he has stopped coughing since he came in and feels much better than he did before in spite of the chronic illness.  He denies recent fever but has had some chills.  Severe general malaise.  Denies nausea, vomiting, abdominal pain, dysuria.  The acute onset of the coughing is gotten better but he is worried about the fact that his symptoms are persistent after his third round of antibiotics.  He is in no distress at this time.  Past Medical History:  Diagnosis Date  . Gout   . Hypertension   . Thyroid disease     Patient Active Problem List   Diagnosis Date Noted  . HCAP (healthcare-associated pneumonia) 01/11/2018  . Ileus (Micro) 02/03/2017  . Enteritis     Past Surgical History:  Procedure Laterality Date  . CHOLECYSTECTOMY    . TOTAL HIP ARTHROPLASTY Right     Prior to Admission medications   Medication Sig Start Date End Date Taking? Authorizing Provider  albuterol (PROVENTIL HFA;VENTOLIN  HFA) 108 (90 Base) MCG/ACT inhaler Inhale 1-2 puffs into the lungs every 6 (six) hours as needed for wheezing or shortness of breath.    Yes [provider]  allopurinol (ZYLOPRIM) 300 MG tablet Take 300 mg by mouth daily.   Yes [provider]  aspirin EC 81 MG tablet Take 81 mg by mouth daily.   Yes [provider]  Cholecalciferol 400 units CAPS Take 400 Units by mouth daily.    Yes [provider]  hydrochlorothiazide (MICROZIDE) 12.5 MG capsule Take 12.5 mg by mouth daily.   Yes [provider]  levofloxacin (LEVAQUIN) 750 MG tablet Take 750 mg by mouth daily. 01/04/18 01/11/18 Yes [provider]  levothyroxine (SYNTHROID, LEVOTHROID) 75 MCG tablet Take 75 mcg by mouth daily.   Yes [provider]  lisinopril (PRINIVIL,ZESTRIL) 40 MG tablet Take 40 mg by mouth daily.   Yes [provider]  tamsulosin (FLOMAX) 0.4 MG CAPS capsule Take 0.4 mg by mouth daily.   Yes [provider]  ondansetron (ZOFRAN) 4 MG tablet Take 1 tablet (4 mg total) by mouth every 8 (eight) hours as needed for nausea or vomiting. Patient not taking: Reported on 01/11/2018 02/14/17   Nance Pear, MD  traMADol (ULTRAM) 50 MG tablet Take 1 tablet (50 mg total) by mouth every 6 (six) hours as needed. Patient not taking: Reported on 01/11/2018 02/14/17 02/14/18  Nance Pear, MD    Allergies Patient has  no known allergies.  Family History  Problem Relation Age of Onset  . Hypertension Other     Social History Social History   Tobacco Use  . Smoking status: Never Smoker  . Smokeless tobacco: Never Used  Substance Use Topics  . Alcohol use: Yes  . Drug use: No    Review of Systems Constitutional: Chills, no recent fever, general malaise and fatigue for weeks to months Eyes: No visual changes. ENT: No sore throat. Cardiovascular: Denies chest pain. Respiratory: Acute worsening of his cough after aspiration episode, now better,  but with persistent cough for several weeks or months Gastrointestinal: No abdominal pain.  No nausea, no vomiting.  No diarrhea.  No constipation. Genitourinary: Negative for dysuria. Musculoskeletal: Negative for neck pain.  Negative for back pain. Integumentary: Negative for rash. Neurological: Negative for headaches, focal weakness or numbness.   ____________________________________________   PHYSICAL EXAM:  VITAL SIGNS: ED Triage Vitals [01/10/18 2219]  Enc Vitals Group     BP (!) 145/76     Pulse Rate (!) 105     Resp 20     Temp 99.7 F (37.6 C)     Temp Source Oral     SpO2 95 %     Weight      Height      Head Circumference      Peak Flow      Pain Score      Pain Loc      Pain Edu?      Excl. in [place]?     Constitutional: Alert and oriented. Well appearing and in no acute distress. Eyes: Conjunctivae are normal.  Head: Atraumatic. Nose: No congestion/rhinnorhea. Mouth/Throat: Mucous membranes are moist. Neck: No stridor.  No meningeal signs.   Cardiovascular: Normal rate, regular rhythm. Good peripheral circulation. Grossly normal heart sounds. Respiratory: Normal respiratory effort.  No retractions.  Crackles noted in right lung base. Gastrointestinal: Soft and nontender. No distention.  Musculoskeletal: No lower extremity tenderness nor edema. No gross deformities of extremities. Neurologic:  Normal speech and language. No gross focal neurologic deficits are appreciated.  Skin:  Skin is warm, dry and intact. No rash noted. Psychiatric: Mood and affect are normal. Speech and behavior are normal.  ____________________________________________   LABS (all labs ordered are listed, but only abnormal results are displayed)  Labs Reviewed  COMPREHENSIVE METABOLIC PANEL - Abnormal; Notable for the following components:      Result Value   Sodium 131 (*)    Chloride 96 (*)    Glucose, Bld 124 (*)    BUN 23 (*)    Calcium 8.8 (*)    All other components  within normal limits  CBC WITH DIFFERENTIAL/PLATELET - Abnormal; Notable for the following components:   WBC 13.3 (*)    RBC 4.31 (*)    Hemoglobin 12.5 (*)    HCT 37.6 (*)    RDW 14.8 (*)    Neutro Abs 12.0 (*)    Lymphs Abs 0.6 (*)    All other components within normal limits  URINALYSIS, COMPLETE (UACMP) WITH MICROSCOPIC - Abnormal; Notable for the following components:   Color, Urine YELLOW (*)    APPearance HAZY (*)    Hgb urine dipstick MODERATE (*)    Protein, ur 30 (*)    Squamous Epithelial / LPF 0-5 (*)    All other components within normal limits  TSH - Abnormal; Notable for the following components:   TSH 4.895 (*)    All  other components within normal limits  CULTURE, BLOOD (ROUTINE X 2)  CULTURE, BLOOD (ROUTINE X 2)  MRSA PCR SCREENING  URINE CULTURE  RESPIRATORY PANEL BY PCR  CULTURE, EXPECTORATED SPUTUM-ASSESSMENT  LACTIC ACID, PLASMA  LIPASE, BLOOD  BRAIN NATRIURETIC PEPTIDE  TROPONIN I  PROCALCITONIN  INFLUENZA PANEL BY PCR (TYPE A & B)  CK   ____________________________________________  EKG  ED ECG REPORT I, Hinda Kehr, the attending physician, personally viewed and interpreted this ECG.  Date: 01/11/2018 EKG Time: 03:45 Rate: 92 Rhythm: normal sinus rhythm QRS Axis: normal Intervals: normal ST/T Wave abnormalities: Non-specific ST segment / T-wave changes, but no evidence of acute ischemia. Narrative Interpretation: no evidence of acute ischemia   ____________________________________________  RADIOLOGY I, Hinda Kehr, personally viewed and evaluated these images (plain radiographs) as part of my medical decision making, as well as reviewing the written report by the radiologist.  ED MD interpretation:  RLL infiltrate  Official radiology report(s): Dg Chest 2 View  Result Date: 01/10/2018 CLINICAL DATA:  Fever cough and shortness of breath. EXAM: CHEST  2 VIEW COMPARISON:  11/23/2017 chest CT. FINDINGS: RIGHT LOWER lobe airspace  disease is compatible with pneumonia. Cardiomediastinal silhouette is unremarkable. No pleural effusion or pneumothorax. No acute bony abnormalities identified. IMPRESSION: RIGHT LOWER lobe airspace disease compatible with pneumonia. Radiographic follow-up to resolution is recommended. Electronically Signed   By: Margarette Canada M.D.   On: 01/10/2018 23:30   Ct Chest Wo Contrast  Result Date: 01/11/2018 CLINICAL DATA:  72 year old male with acute respiratory illness. EXAM: CT CHEST WITHOUT CONTRAST TECHNIQUE: Multidetector CT imaging of the chest was performed following the standard protocol without IV contrast. COMPARISON:  Chest radiograph dated 01/10/2018 FINDINGS: Cardiovascular: There is no cardiomegaly or pericardial effusion. Mild atherosclerotic calcification of the thoracic aorta. The central pulmonary arteries are grossly unremarkable on this noncontrast CT. Mediastinum/Nodes: No hilar or mediastinal adenopathy. Esophagus is grossly unremarkable. No mediastinal fluid collection. Lungs/Pleura: Bilateral patchy ground-glass and nodular densities involving the lower lobes, right middle lobe, lingula and to a lesser degree the upper lobes most consistent with multifocal pneumonia. Clinical correlation and follow-up to resolution recommended. There is no pleural effusion or pneumothorax. The central airways are patent. Upper Abdomen: Cholecystectomy.  Mild fatty liver. Musculoskeletal: Degenerative changes of the spine. No acute osseous pathology. IMPRESSION: Multifocal pneumonia. Electronically Signed   By: Anner Crete M.D.   On: 01/11/2018 03:10    ____________________________________________   PROCEDURES  Critical Care performed: No   Procedure(s) performed:   Procedures   ____________________________________________   INITIAL IMPRESSION / ASSESSMENT AND PLAN / ED COURSE  As part of my medical decision making, I reviewed the following data within the [place]  History obtained from family, Nursing notes reviewed and incorporated, Labs reviewed , EKG interpreted , Old chart reviewed, Radiograph reviewed , Discussed with admitting physician      Differential includes, but is not limited to, viral syndrome, bronchitis including COPD exacerbation, pneumonia, reactive airway disease including asthma, CHF including exacerbation with or without pulmonary/interstitial edema, pneumothorax, ACS, thoracic trauma, and pulmonary embolism.  I reviewed the chart extensively and he has had a  Concerning course of persistent or recurrent pneumonia.  He has an appointment with pulmonology within the next 36 hours or so.  He is in no acute distress now and the impetus behind his ED visit was an apparent aspiration event.  It is concerning to me that even after an extended course of Levaquin he has an  obvious right lower lobe infiltrate on his chest x-ray.  Given the fact that he had a CT scan 6 weeks ago that showed resolution of multi lobar pneumonia, I think it is reasonable to repeat a CT scan to see if he has more than just the right lower lobe infiltrate.  We will also check standard lab work for pneumonia.  The patient does not want to stay in the hospital if there is not an acute or emergent condition to keep him here and I think that is appropriate given that he has pulmonology follow-up, but he does require further workup at this time.   Clinical Course as of Jan 12 820  Mon Jan 11, 2018  0248 Lactic Acid, Venous: 1.3 [CF]  0254 Influenza A By PCR: NEGATIVE [CF]  0254 WBC: (!) 13.3 [CF]  0329 The patient has extensive multi lobar pneumonia which is an acute change since his last CT scan 6 weeks ago.  He has a leukocytosis and is acutely short of breath with any kind of exertion.  Given his recurrent course and the extent of the infection seen on his CT scan, I believe he needs treatment with aggressive and broad-spectrum antibiotics.  I am treating for HCAP with  cefipime and vancomycin.   CT Chest Wo Contrast [CF]  0332 In spite of multilobar pneumonia, patient does not appear septic, and certainly not severe sepsis.  [CF]  [id number] Discussed case by phone with Dr. Marcille Blanco with the hospitalist service who will admit.  [CF]    Clinical Course User Index [CF] Hinda Kehr, MD    ____________________________________________  FINAL CLINICAL IMPRESSION(S) / ED DIAGNOSES  Final diagnoses:  HCAP (healthcare-associated pneumonia)  Dyspnea, unspecified type     MEDICATIONS GIVEN DURING THIS VISIT:  Medications  allopurinol (ZYLOPRIM) tablet 300 mg (not administered)  levothyroxine (SYNTHROID, LEVOTHROID) tablet 75 mcg (75 mcg Oral Given 01/11/18 0811)  aspirin EC tablet 81 mg (81 mg Oral Given 01/11/18 0812)  hydrochlorothiazide (MICROZIDE) capsule 12.5 mg (12.5 mg Oral Given 01/11/18 0812)  lisinopril (PRINIVIL,ZESTRIL) tablet 40 mg (40 mg Oral Given 01/11/18 0819)  tamsulosin (FLOMAX) capsule 0.4 mg (not administered)  enoxaparin (LOVENOX) injection 40 mg (not administered)  acetaminophen (TYLENOL) tablet 650 mg (650 mg Oral Given 01/11/18 0812)    Or  acetaminophen (TYLENOL) suppository 650 mg ( Rectal See Alternative 01/11/18 0812)  docusate sodium (COLACE) capsule 100 mg (100 mg Oral Given 01/11/18 0812)  ondansetron (ZOFRAN) tablet 4 mg (not administered)    Or  ondansetron (ZOFRAN) injection 4 mg (not administered)  albuterol (PROVENTIL) (2.5 MG/3ML) 0.083% nebulizer solution 2.5 mg (not administered)  ceFEPIme (MAXIPIME) 2 g in sodium chloride 0.9 % 100 mL IVPB (not administered)  vancomycin (VANCOCIN) 1,250 mg in sodium chloride 0.9 % 250 mL IVPB (not administered)  cholecalciferol (VITAMIN D) tablet 500 Units (500 Units Oral Given 01/11/18 0811)  traMADol (ULTRAM) tablet 50 mg (not administered)  ceFEPIme (MAXIPIME) 2 g in sodium chloride 0.9 % 100 mL IVPB (0 g Intravenous Stopped 01/11/18 0425)  vancomycin (VANCOCIN) IVPB 1000 mg/200 mL premix  (0 mg Intravenous Stopped 01/11/18 0520)  sodium chloride 0.9 % bolus 1,000 mL (0 mLs Intravenous Stopped 01/11/18 0520)     ED Discharge Orders    None       Note:  This document was prepared using Dragon voice recognition software and may include unintentional dictation errors.    Hinda Kehr, MD 01/11/18 [phone]

## 2018-01-11 NOTE — ED Notes (Signed)
Hospitalist at bedside 

## 2018-01-11 NOTE — ED Notes (Signed)
Back from CT

## 2018-01-11 NOTE — Progress Notes (Signed)
Patient briefly seen and examined this morning. Patient here with recurrent pneumonias. I will obtain pulmonary evaluation. CT chest did not demonstrate lung mass. Also c/o joint pains. He has seen rheumatologist in the past.  Ordered ANA, esr, crp Further workup after pulmonary evaluation

## 2018-01-11 NOTE — Plan of Care (Signed)
Pt. Progressing in care [plan. Complaint of muscle pain through out day. MD consulted medication prescribed. Lad test ordered. Plan for consult to Respiratory. Will continue to monitor

## 2018-01-11 NOTE — Progress Notes (Signed)
Pharmacy Antibiotic Note  Logan Murphy is a 72 y.o. male admitted on 01/11/2018 with pneumonia.  Pharmacy has been consulted for vancomycin and cefepime dosing.  Plan: DW 91kg  Vd 64L kei 0.068 hr-1  T1/2 10 hours Vancomycin 1250 mg q 12 hours ordered with stacked dosing. Level before 5th dose. Goal trough 15-20  Cefepime 2 grams q 8 hours ordered.  Height: 6\' 1"  (185.4 cm) Weight: 239 lb (108.4 kg) IBW/kg (Calculated) : 79.9  Temp (24hrs), Avg:99.2 F (37.3 C), Min:98.7 F (37.1 C), Max:99.7 F (37.6 C)  Recent Labs  Lab 01/11/18 0147 01/11/18 0148  WBC  --  13.3*  CREATININE  --  1.12  LATICACIDVEN 1.3  --     Estimated Creatinine Clearance: 77 mL/min (by C-G formula based on SCr of 1.12 mg/dL).    No Known Allergies  Antimicrobials this admission: Vancomycin, cefepime 2/25  >>    >>   Dose adjustments this admission:   Microbiology results: 2/25 BCx: pending 2/25 UCx: pending  2/25 MRSA PCR: pending      2/25 CT chest: multifocal pneumonia 2/25 UA: (-)  Thank you for allowing pharmacy to be a part of this patient's care.  Tynika Luddy S 01/11/2018 5:11 AM

## 2018-01-11 NOTE — Progress Notes (Addendum)
Initial Nutrition Assessment  DOCUMENTATION CODES:   Obesity unspecified  INTERVENTION:  Recommend liberalizing diet to regular.  Provide Premier Protein po once daily, each supplement provides 160 kcal and 30 grams of protein. Patient prefers vanilla.  Patient would benefit from daily MVI, but he reports he cannot take one as he is enrolled in a blinded research study on cocoa powder and MVI.  NUTRITION DIAGNOSIS:   Inadequate oral intake related to decreased appetite as evidenced by per patient/family report.  GOAL:   Patient will meet greater than or equal to 90% of their needs  MONITOR:   PO intake, Supplement acceptance, Labs, Weight trends, I & O's  REASON FOR ASSESSMENT:   Malnutrition Screening Tool    ASSESSMENT:   72 year old male with PMHx of gout, hypothyroidism, HTN, and surgical hx of cholecystectomy and right total hip arthroplasty now admitted with recurrent PNA now on third round of Levaquin.   Met with patient in room. He was dressed in pajamas, sitting in chair and reading. He reports he has been having recurrent PNA for the past 4 months now. His decreased appetite began about 2 months ago. He lives at [facility] so he goes to dining room for 3 meals daily. He typically eats 100% of 3 meals daily including dessert. Lately he has been eating 50% or less of his meals. He has also cut out desserts, partially because he has been wanting to lose weight. He reports the food is very bland here so he is having trouble eating it. He is amenable to drinking Premier Protein to help meet protein needs. He is currently enrolled in a research study through [employer] on cocoa powder and MVI, so he cannot take a regular MVI as it is a blinded study.  UBW 238 lbs. Patient reports he has lost a few pounds. Patient weight-stable according to current weight.  Meal Completion: 100% of breakfast today per chart  Medications reviewed and include: allopurinol 300 mg daily,  vitamin D 500 units daily, Colace, levothyroxine, Flomax, cefepime, vancomycin.  Labs reviewed: Sodium 131, Chloride 96, BUN 23.  Patient does not meet criteria for malnutrition.  NUTRITION - FOCUSED PHYSICAL EXAM:    Most Recent Value  Orbital Region  No depletion  Upper Arm Region  Mild depletion  Thoracic and Lumbar Region  No depletion  Buccal Region  No depletion  Temple Region  No depletion  Clavicle Bone Region  No depletion  Clavicle and Acromion Bone Region  No depletion  Scapular Bone Region  No depletion  Dorsal Hand  No depletion  Patellar Region  No depletion  Anterior Thigh Region  No depletion  Posterior Calf Region  No depletion  Edema (RD Assessment)  None  Hair  Reviewed  Eyes  Reviewed  Mouth  Reviewed  Skin  Reviewed  Nails  Reviewed     Diet Order:  Diet Heart Room service appropriate? Yes; Fluid consistency: Thin  EDUCATION NEEDS:   No education needs have been identified at this time  Skin:  Skin Assessment: Reviewed RN Assessment(no issues)  Last BM:  PTA (01/10/2018 per chart)  Height:   Ht Readings from Last 1 Encounters:  01/11/18 '6\' 1"'  (1.854 m)    Weight:   Wt Readings from Last 1 Encounters:  01/11/18 241 lb 10 oz (109.6 kg)    Ideal Body Weight:  83.6 kg  BMI:  Body mass index is 31.88 kg/m.  Estimated Nutritional Needs:   Kcal:  2095-2280 (  MSJ x 1.1-1.2)  Protein:  90-110 grams (0.8-1 grams/kg)  Fluid:  2 L/day (25 mL/kg)  Willey Blade, MS, RD, LDN Office: [phone] Pager: [phone] After Hours/Weekend Pager: [phone]

## 2018-01-11 NOTE — H&P (Signed)
Logan Murphy is an 72 y.o. male.   Chief Complaint: Cough HPI: The patient with past medical history of hypothyroidism, hypertension and gout presents to the emergency department complaining of severe cough.  The patient has had recurrent pneumonia twice in the last 4 months.  He is completed 2 courses of Levaquin and steroids with a CT of the chest to confirm resolution of pneumonia after the last course.  However he has developed multifocal pneumonia yet again and is on his third round of Levaquin.  Today the patient described a minor aspiration-like event while reclining during a movie.  In the emergency department CT of his chest confirmed multi lobar bilateral pneumonia.  Oxygen saturations were low to normal on room air but the patient's exercise tolerance was dramatically decreased.  He admits to severe fatigue and shortness of breath at times.  The patient was started on cefepime and vancomycin prior to the urgency department staff calling the hospitalist service for admission.  Past Medical History:  Diagnosis Date  . Gout   . Hypertension   . Thyroid disease     Past Surgical History:  Procedure Laterality Date  . CHOLECYSTECTOMY    . TOTAL HIP ARTHROPLASTY Right     Family History  Problem Relation Age of Onset  . Hypertension Other    Social History:  reports that  has never smoked. he has never used smokeless tobacco. He reports that he drinks alcohol. He reports that he does not use drugs.  Allergies: No Known Allergies  Medications Prior to Admission  Medication Sig Dispense Refill  . albuterol (PROVENTIL HFA;VENTOLIN HFA) 108 (90 Base) MCG/ACT inhaler Inhale 1-2 puffs into the lungs every 6 (six) hours as needed for wheezing or shortness of breath.     . allopurinol (ZYLOPRIM) 300 MG tablet Take 300 mg by mouth daily.    Marland Kitchen aspirin EC 81 MG tablet Take 81 mg by mouth daily.    . Cholecalciferol 400 units CAPS Take 400 Units by mouth daily.     . hydrochlorothiazide  (MICROZIDE) 12.5 MG capsule Take 12.5 mg by mouth daily.    Marland Kitchen levofloxacin (LEVAQUIN) 750 MG tablet Take 750 mg by mouth daily.    Marland Kitchen levothyroxine (SYNTHROID, LEVOTHROID) 75 MCG tablet Take 75 mcg by mouth daily.    Marland Kitchen lisinopril (PRINIVIL,ZESTRIL) 40 MG tablet Take 40 mg by mouth daily.    . tamsulosin (FLOMAX) 0.4 MG CAPS capsule Take 0.4 mg by mouth daily.    . ondansetron (ZOFRAN) 4 MG tablet Take 1 tablet (4 mg total) by mouth every 8 (eight) hours as needed for nausea or vomiting. (Patient not taking: Reported on 01/11/2018) 20 tablet 0  . traMADol (ULTRAM) 50 MG tablet Take 1 tablet (50 mg total) by mouth every 6 (six) hours as needed. (Patient not taking: Reported on 01/11/2018) 15 tablet 0    Results for orders placed or performed during the hospital encounter of 01/11/18 (from the past 48 hour(s))  Lactic acid, plasma     Status: None   Collection Time: 01/11/18  1:47 AM  Result Value Ref Range   Lactic Acid, Venous 1.3 0.5 - 1.9 mmol/L    Comment: Performed at [facility], [address] [place], [place] [address]  Comprehensive metabolic panel     Status: Abnormal   Collection Time: 01/11/18  1:48 AM  Result Value Ref Range   Sodium 131 (L) 135 - 145 mmol/L   Potassium 4.1 3.5 - 5.1 mmol/L  Chloride 96 (L) 101 - 111 mmol/L   CO2 26 22 - 32 mmol/L   Glucose, Bld 124 (H) 65 - 99 mg/dL   BUN 23 (H) 6 - 20 mg/dL   Creatinine, Ser 1.12 0.61 - 1.24 mg/dL   Calcium 8.8 (L) 8.9 - 10.3 mg/dL   Total Protein 7.5 6.5 - 8.1 g/dL   Albumin 3.6 3.5 - 5.0 g/dL   AST 29 15 - 41 U/L   ALT 26 17 - 63 U/L   Alkaline Phosphatase 47 38 - 126 U/L   Total Bilirubin 1.0 0.3 - 1.2 mg/dL   GFR calc non Af Amer >60 >60 mL/min   GFR calc Af Amer >60 >60 mL/min    Comment: (NOTE) The eGFR has been calculated using the CKD EPI equation. This calculation has not been validated in all clinical situations. eGFR's persistently <60 mL/min signify possible Chronic Kidney Disease.    Anion  gap 9 5 - 15    Comment: Performed at [facility], [address] [place], [place] [address]  Lipase, blood     Status: None   Collection Time: 01/11/18  1:48 AM  Result Value Ref Range   Lipase 34 11 - 51 U/L    Comment: Performed at [facility], [address] [place], [place] [address]  Brain natriuretic peptide - IF patient is dyspneic     Status: None   Collection Time: 01/11/18  1:48 AM  Result Value Ref Range   B Natriuretic Peptide 35.0 0.0 - 100.0 pg/mL    Comment: Performed at [facility], [address] [place], [place] [address]  Troponin I     Status: None   Collection Time: 01/11/18  1:48 AM  Result Value Ref Range   Troponin I <0.03 <0.03 ng/mL    Comment: Performed at [facility], [address] [place], [place] [address]  CBC WITH DIFFERENTIAL     Status: Abnormal   Collection Time: 01/11/18  1:48 AM  Result Value Ref Range   WBC 13.3 (H) 3.8 - 10.6 K/uL   RBC 4.31 (L) 4.40 - 5.90 MIL/uL   Hemoglobin 12.5 (L) 13.0 - 18.0 g/dL   HCT 37.6 (L) 40.0 - 52.0 %   MCV 87.3 80.0 - 100.0 fL   MCH 29.0 26.0 - 34.0 pg   MCHC 33.2 32.0 - 36.0 g/dL   RDW 14.8 (H) 11.5 - 14.5 %   Platelets 257 150 - 440 K/uL   Neutrophils Relative % 91 %   Neutro Abs 12.0 (H) 1.4 - 6.5 K/uL   Lymphocytes Relative 5 %   Lymphs Abs 0.6 (L) 1.0 - 3.6 K/uL   Monocytes Relative 4 %   Monocytes Absolute 0.6 0.2 - 1.0 K/uL   Eosinophils Relative 0 %   Eosinophils Absolute 0.0 0 - 0.7 K/uL   Basophils Relative 0 %   Basophils Absolute 0.0 0 - 0.1 K/uL    Comment: Performed at [facility], [address] [place], [place] [address]  Procalcitonin     Status: None   Collection Time: 01/11/18  1:48 AM  Result Value Ref Range   Procalcitonin 0.18 ng/mL    Comment:        Interpretation: PCT (Procalcitonin) <= 0.5 ng/mL: Systemic infection (sepsis) is not likely. Local bacterial infection is possible. (NOTE)       Sepsis PCT Algorithm            Lower Respiratory Tract  Infection PCT Algorithm    ----------------------------     ----------------------------         PCT < 0.25 ng/mL                PCT < 0.10 ng/mL         Strongly encourage             Strongly discourage   discontinuation of antibiotics    initiation of antibiotics    ----------------------------     -----------------------------       PCT 0.25 - 0.50 ng/mL            PCT 0.10 - 0.25 ng/mL               OR       >80% decrease in PCT            Discourage initiation of                                            antibiotics      Encourage discontinuation           of antibiotics    ----------------------------     -----------------------------         PCT >= 0.50 ng/mL              PCT 0.26 - 0.50 ng/mL               AND        <80% decrease in PCT             Encourage initiation of                                             antibiotics       Encourage continuation           of antibiotics    ----------------------------     -----------------------------        PCT >= 0.50 ng/mL                  PCT > 0.50 ng/mL               AND         increase in PCT                  Strongly encourage                                      initiation of antibiotics    Strongly encourage escalation           of antibiotics                                     -----------------------------                                           PCT <= 0.25 ng/mL  OR                                        > 80% decrease in PCT                                     Discontinue / Do not initiate                                             antibiotics Performed at [facility], [address] [place], [place] [address]   Urinalysis, Complete w Microscopic     Status: Abnormal   Collection Time: 01/11/18  1:48 AM  Result Value Ref Range   Color, Urine YELLOW (A) YELLOW   APPearance HAZY  (A) CLEAR   Specific Gravity, Urine 1.023 1.005 - 1.030   pH 5.0 5.0 - 8.0   Glucose, UA NEGATIVE NEGATIVE mg/dL   Hgb urine dipstick MODERATE (A) NEGATIVE   Bilirubin Urine NEGATIVE NEGATIVE   Ketones, ur NEGATIVE NEGATIVE mg/dL   Protein, ur 30 (A) NEGATIVE mg/dL   Nitrite NEGATIVE NEGATIVE   Leukocytes, UA NEGATIVE NEGATIVE   RBC / HPF 6-30 0 - 5 RBC/hpf   WBC, UA 0-5 0 - 5 WBC/hpf   Bacteria, UA NONE SEEN NONE SEEN   Squamous Epithelial / LPF 0-5 (A) NONE SEEN   Mucus PRESENT     Comment: Performed at [facility], [address] [place], [place] [address]  Influenza panel by PCR (type A & B)     Status: None   Collection Time: 01/11/18  1:48 AM  Result Value Ref Range   Influenza A By PCR NEGATIVE NEGATIVE   Influenza B By PCR NEGATIVE NEGATIVE    Comment: (NOTE) The Xpert Xpress Flu assay is intended as an aid in the diagnosis of  influenza and should not be used as a sole basis for treatment.  This  assay is FDA approved for nasopharyngeal swab specimens only. Nasal  washings and aspirates are unacceptable for Xpert Xpress Flu testing. Performed at [facility], [address] [place], [place] [address]   TSH     Status: Abnormal   Collection Time: 01/11/18  1:48 AM  Result Value Ref Range   TSH 4.895 (H) 0.350 - 4.500 uIU/mL    Comment: Performed by a 3rd Generation assay with a functional sensitivity of <=0.01 uIU/mL. Performed at [facility], [address] [place], [place] [address]    Dg Chest 2 View  Result Date: 01/10/2018 CLINICAL DATA:  Fever cough and shortness of breath. EXAM: CHEST  2 VIEW COMPARISON:  11/23/2017 chest CT. FINDINGS: RIGHT LOWER lobe airspace disease is compatible with pneumonia. Cardiomediastinal silhouette is unremarkable. No pleural effusion or pneumothorax. No acute bony abnormalities identified. IMPRESSION: RIGHT LOWER lobe airspace disease compatible with pneumonia. Radiographic follow-up to resolution  is recommended. Electronically Signed   By: Margarette Canada M.D.   On: 01/10/2018 23:30   Ct Chest Wo Contrast  Result Date: 01/11/2018 CLINICAL DATA:  72 year old male with acute respiratory illness. EXAM: CT CHEST WITHOUT CONTRAST TECHNIQUE: Multidetector CT imaging of the chest was performed following the standard protocol without IV contrast. COMPARISON:  Chest radiograph dated 01/10/2018 FINDINGS: Cardiovascular: There is no cardiomegaly or pericardial effusion. Mild atherosclerotic calcification of the thoracic aorta. The central pulmonary arteries are grossly unremarkable on this noncontrast CT. Mediastinum/Nodes: No hilar or mediastinal adenopathy. Esophagus is grossly unremarkable. No mediastinal fluid collection. Lungs/Pleura: Bilateral patchy ground-glass and nodular densities involving the lower lobes, right middle lobe, lingula and to a lesser degree the upper lobes most consistent with multifocal pneumonia. Clinical correlation and follow-up to resolution recommended. There is no pleural effusion or pneumothorax. The central airways are patent. Upper Abdomen: Cholecystectomy.  Mild fatty liver. Musculoskeletal: Degenerative changes of the spine. No acute osseous pathology. IMPRESSION: Multifocal pneumonia. Electronically Signed   By: Anner Crete M.D.   On: 01/11/2018 03:10    Review of Systems  Constitutional: Negative for chills and fever.  HENT: Negative for sore throat and tinnitus.   Eyes: Negative for blurred vision and redness.  Respiratory: Positive for cough. Negative for shortness of breath.   Cardiovascular: Negative for chest pain, palpitations, orthopnea and PND.  Gastrointestinal: Negative for abdominal pain, diarrhea, nausea and vomiting.  Genitourinary: Negative for dysuria, frequency and urgency.  Musculoskeletal: Negative for joint pain and myalgias.  Skin: Negative for rash.       No lesions  Neurological: Negative for speech change, focal weakness and weakness.   Endo/Heme/Allergies: Does not bruise/bleed easily.       No temperature intolerance  Psychiatric/Behavioral: Negative for depression and suicidal ideas.    Blood pressure (!) 150/72, pulse 86, temperature 98.4 F (36.9 C), temperature source Oral, resp. rate 18, height _0  (1.854 m), weight 109.6 kg (241 lb 10 oz), SpO2 97 %. Physical Exam  Vitals reviewed. Constitutional: He is oriented to person, place, and time. He appears well-developed and well-nourished. No distress.  HENT:  Head: Normocephalic and atraumatic.  Mouth/Throat: Oropharynx is clear and moist.  Eyes: Conjunctivae and EOM are normal. Pupils are equal, round, and reactive to light. No scleral icterus.  Neck: Normal range of motion. Neck supple. No JVD present. No tracheal deviation present. No thyromegaly present.  Cardiovascular: Normal rate, regular rhythm and normal heart sounds. Exam reveals no gallop and no friction rub.  No murmur heard. Respiratory: Effort normal and breath sounds normal. No respiratory distress.  GI: Soft. Bowel sounds are normal. He exhibits no distension. There is no tenderness.  Genitourinary:  Genitourinary Comments: Deferred  Musculoskeletal: Normal range of motion. He exhibits no edema.  Lymphadenopathy:    He has no cervical adenopathy.  Neurological: He is alert and oriented to person, place, and time. No cranial nerve deficit.  Skin: Skin is warm and dry. No rash noted. No erythema.  Psychiatric: He has a normal mood and affect. His behavior is normal. Judgment and thought content normal.     Assessment/Plan This is a 72 year old male admitted for pneumonia. 1.  Pneumonia: Healthcare associated; no hypoxia.  Patient's functional status has severely declined over the last 4 months.  He has failed oral antibiotic therapy as an outpatient.  Will admit to medical surgical floor for IV antibiotics and supplemental oxygen as needed.  Albuterol as needed for shortness of breath.  Need to  obtain sputum sample for definitive diagnosis.  The patient denies sick contacts.  He states that his symptoms began after changing the air filters in his home which was occupied by multiple dogs prior to the eviction of the previous owners.  He denies fevers or skin lesions.  His functional status declined after a long  flight during which he developed a petechial rash on his lower extremities.  At that time he underwent CTA of the chest to rule out pulmonary embolism.  He also admits to falling in a creek while fishing at some point this winter but he has undergone outpatient pulmonary function testing as well as diagnostic workup for legionnaires disease which was negative.  Obtain sputum for fungal and bacterial culture. 2.  Hypertension: Controlled; continue lisinopril and hydrochlorothiazide 3.  Hypothyroidism: Check TSH; continue Synthroid 4.  Gout: Controlled.  Continue allopurinol 5.  BPH: Continue tamsulosin 6.  DVT prophylaxis: Lovenox 7.  GI prophylaxis: None The patient is a full code.  Time spent on admission orders and patient care approximately 45 minutes  Harrie Foreman, MD 01/11/2018, 6:50 AM

## 2018-01-11 NOTE — ED Notes (Signed)
Patient transported to CT via stretcher.

## 2018-01-11 NOTE — Progress Notes (Signed)
Physical Therapy Evaluation Patient Details Name: Logan Murphy MRN: [id number] DOB: 01/29/1946 Today's Date: 01/11/2018   History of Present Illness  Pt reports repeated PNA since October of 2018. He is currently admitted at [facility] for HCAP. PMH includes HTN, hypothyroidism, gout, and BPH.   Clinical Impression  Pt is supervision for transfers and CGA for ambulation x 200' around RN station. Pt ambulates with good stability and safety awareness. VSS throughout ambulation and pt denies DOE. Safe use of rolling walker. Gait speed slightly decreased but functional for limited community mobility. Pt has some R shoulder pain which he reports started acutely. No known trauma or prior injury. Denies chest pain or SOB. He describes pain at rest as well as with active movement of R shoulder. AROM of R shoulder appears grossly full although painful. R shoulder strength is 5/5 for abduction, flexion, ER, and IR. No pain with palpation. Pt reports prior removal of gallbladder. Pt encouraged to follow-up with PCP regarding issue and to mention to hospitalist when rounding. Pt could benefit from OP PT for LE strengthening due to progressive deconditioning but refuses at this time. He is very active at baseline and feels comfortable returning to the gym independently. Order will be completed. Please enter new order if status or needs change.     Follow Up Recommendations Outpatient PT;Other (comment)(Pt declines)    Equipment Recommendations  None recommended by PT;Other (comment)(Continue use of walker as needed)    Recommendations for Other Services       Precautions / Restrictions Precautions Precautions: None Restrictions Weight Bearing Restrictions: No      Mobility  Bed Mobility               General bed mobility comments: received and left upright inr ecliner  Transfers Overall transfer level: Needs assistance Equipment used: Rolling walker (2 wheeled) Transfers: Sit to/from  Stand Sit to Stand: Supervision         General transfer comment: Cues for safe hand placement. Definite need for UE support. Steady and stable once in standing. Increased time required  Ambulation/Gait Ambulation/Gait assistance: Min guard Ambulation Distance (Feet): 200 Feet Assistive device: Rolling walker (2 wheeled)       General Gait Details: Pt ambulates with good stability and safety awareness. VSS throughout ambulation and pt denies DOE. Safe use of rolling walker. Gait speed slightly decreased but functional for limited community mobility  Stairs            Wheelchair Mobility    Modified Rankin (Stroke Patients Only)       Balance Overall balance assessment: Needs assistance Sitting-balance support: No upper extremity supported Sitting balance-Leahy Scale: Good     Standing balance support: No upper extremity supported Standing balance-Leahy Scale: Good Standing balance comment: Negative Romberg, single leg balance is 8 seconds, no falls history                             Pertinent Vitals/Pain Pain Assessment: 0-10 Pain Score: 8  Pain Location: R shoulder pain Pain Intervention(s): Monitored during session    Home Living Family/patient expects to be discharged to:: Private residence Living Arrangements: Spouse/significant other Available Help at Discharge: Family Type of Home: House(Independent cottage at [place]) Home Access: Level entry     Home Layout: One level Home Equipment: Environmental consultant - 2 wheels;Cane - single point      Prior Function Level of Independence: Independent  Comments: Independent with ADLs/IADLs. At baseline no AD for ambulation. No falls in the last 12 months. Drives     Hand Dominance   Dominant Hand: Right    Extremity/Trunk Assessment   Upper Extremity Assessment Upper Extremity Assessment: RUE deficits/detail RUE Deficits / Details: R shoulder pain with AROM flexion and  abduction    Lower Extremity Assessment Lower Extremity Assessment: Generalized weakness       Communication   Communication: No difficulties  Cognition Arousal/Alertness: Awake/alert Behavior During Therapy: WFL for tasks assessed/performed Overall Cognitive Status: Within Functional Limits for tasks assessed                                        General Comments      Exercises     Assessment/Plan    PT Assessment All further PT needs can be met in the next venue of care  PT Problem List Decreased strength       PT Treatment Interventions      PT Goals (Current goals can be found in the Care Plan section)  Acute Rehab PT Goals PT Goal Formulation: All assessment and education complete, DC therapy    Frequency     Barriers to discharge        Co-evaluation               AM-PAC PT "6 Clicks" Daily Activity  Outcome Measure Difficulty turning over in bed (including adjusting bedclothes, sheets and blankets)?: None Difficulty moving from lying on back to sitting on the side of the bed? : None Difficulty sitting down on and standing up from a chair with arms (e.g., wheelchair, bedside commode, etc,.)?: A Little Help needed moving to and from a bed to chair (including a wheelchair)?: A Little Help needed walking in hospital room?: None Help needed climbing 3-5 steps with a railing? : None 6 Click Score: 22    End of Session Equipment Utilized During Treatment: Gait belt Activity Tolerance: Patient tolerated treatment well Patient left: in chair;with call bell/phone within reach;with family/visitor present   PT Visit Diagnosis: Muscle weakness (generalized) (M62.81)    Time: 2637-8588 PT Time Calculation (min) (ACUTE ONLY): 20 min   Charges:   PT Evaluation $PT Eval Low Complexity: 1 Low     PT G Codes:        Michael Walrath D Johngabriel Verde PT, DPT    Madisun Hargrove 01/11/2018, 3:25 PM

## 2018-01-12 ENCOUNTER — Inpatient Hospital Stay: Payer: Medicare Other

## 2018-01-12 DIAGNOSIS — J8489 Other specified interstitial pulmonary diseases: Secondary | ICD-10-CM

## 2018-01-12 DIAGNOSIS — J189 Pneumonia, unspecified organism: Secondary | ICD-10-CM

## 2018-01-12 LAB — BASIC METABOLIC PANEL
ANION GAP: 7 (ref 5–15)
BUN: 20 mg/dL (ref 6–20)
CALCIUM: 8.4 mg/dL — AB (ref 8.9–10.3)
CO2: 25 mmol/L (ref 22–32)
Chloride: 97 mmol/L — ABNORMAL LOW (ref 101–111)
Creatinine, Ser: 0.84 mg/dL (ref 0.61–1.24)
GFR calc non Af Amer: >60 mL/min (ref >60)
Glucose, Bld: 120 mg/dL — ABNORMAL HIGH (ref 65–99)
POTASSIUM: 3.9 mmol/L (ref 3.5–5.1)
SODIUM: 129 mmol/L — AB (ref 135–145)

## 2018-01-12 LAB — ANA COMPREHENSIVE PANEL
DS DNA AB: 1 [IU]/mL (ref 0–9)
Ribonucleic Protein: <0.2 AI (ref 0.0–0.9)
SSA (Ro) (ENA) Antibody, IgG: <0.2 AI (ref 0.0–0.9)
Scleroderma (Scl-70) (ENA) Antibody, IgG: <0.2 AI (ref 0.0–0.9)

## 2018-01-12 LAB — URINE CULTURE: CULTURE: NO GROWTH

## 2018-01-12 LAB — CBC
HCT: 33.8 % — ABNORMAL LOW (ref 40.0–52.0)
Hemoglobin: 11.4 g/dL — ABNORMAL LOW (ref 13.0–18.0)
MCH: 29.6 pg (ref 26.0–34.0)
MCHC: 33.7 g/dL (ref 32.0–36.0)
MCV: 87.9 fL (ref 80.0–100.0)
Platelets: 223 10*3/uL (ref 150–440)
RBC: 3.85 MIL/uL — AB (ref 4.40–5.90)
RDW: 14.5 % (ref 11.5–14.5)
WBC: 7 10*3/uL (ref 3.8–10.6)

## 2018-01-12 LAB — MONONUCLEOSIS SCREEN: MONO SCREEN: NEGATIVE

## 2018-01-12 LAB — CK: Total CK: 74 U/L (ref 49–397)

## 2018-01-12 MED ORDER — PROSIGHT PO TABS
1.0000 | ORAL_TABLET | Freq: Every day | ORAL | Status: DC
  Filled 2018-01-12: qty 1

## 2018-01-12 MED ORDER — METHYLPREDNISOLONE SODIUM SUCC 125 MG IJ SOLR
80.0000 mg | Freq: Every day | INTRAMUSCULAR | Status: DC
  Administered 2018-01-12 – 2018-01-14 (×3): 80 mg via INTRAVENOUS
  Filled 2018-01-12 (×3): qty 2

## 2018-01-12 MED ORDER — MORPHINE SULFATE (PF) 2 MG/ML IV SOLN
2.0000 mg | INTRAVENOUS | Status: DC | PRN
  Administered 2018-01-12: 03:00:00 2 mg via INTRAVENOUS
  Filled 2018-01-12: qty 1

## 2018-01-12 MED ORDER — ENOXAPARIN SODIUM 40 MG/0.4ML ~~LOC~~ SOLN: 40.0000 mg | SUBCUTANEOUS | Status: DC

## 2018-01-12 MED ORDER — SODIUM CHLORIDE 0.9 % IV SOLN
INTRAVENOUS | Status: DC
  Administered 2018-01-12: 15:00:00 via INTRAVENOUS

## 2018-01-12 NOTE — Consult Note (Signed)
Logan Murphy Pulmonary Medicine Consultation      Assessment and Plan:  Bilateral multifocal pneumonia with groundglass changes of uncertain etiology, there is a broad differential for these findings including organizing pneumonia, secondary to rheumatological disease, as well as hypersensitivity pneumonitis, versus infectious etiology. --D/w patient, will plan for bronchoscopy, tentatively 2/27 afternoon.  --Will make NPO after MN.    Date: 01/12/2018  MRN# [id number] REFUJIO HAYMER Nov 22, 1945  Referring Physician: Dr. Raul Del for possible bronchoscopy   MYLIN HIRANO is a 72 y.o. old male seen in consultation for chief complaint of:    Chief Complaint  Patient presents with  . Pneumonia    HPI:   Patient is a 72 year old male. Patient has been having severe joint pains for the past several months.  He also been noted to have recurrent course of pneumonia in the past few months with multiple courses of antibiotics and steroids he had a rheumatoid factor and sedimentation rate which were checked which were 24 and 32 respectively.  He was then referred to rheumatology and further workup was ordered He presented to the emergency room on 2/24 after a possible aspiration event where he started coughing profusely after trying to eat a Junior mint.  He continued to feel tired and fatigued wears previously he used to be active with golfing. He underwent a CT of the chest which showed another course of pneumonia for which she was admitted to the hospital  Images personally reviewed, CT chest 01/11/18;  this showed multifocal multi lobar infiltrates, predominantly bronchocentric in the right middle lobe and lingula.  These infiltrates were probably upper lobe, bronchocentric CT chest from 09/28/17, the infiltrates then completely resolved on subsequent CT chest on 1/7.  PMHX:   Past Medical History:  Diagnosis Date  . Gout   . Hypertension   . Thyroid disease    Surgical Hx:  Past  Surgical History:  Procedure Laterality Date  . CHOLECYSTECTOMY    . TOTAL HIP ARTHROPLASTY Right    Family Hx:  Family History  Problem Relation Age of Onset  . Hypertension Other    Social Hx:   Social History   Tobacco Use  . Smoking status: Never Smoker  . Smokeless tobacco: Never Used  Substance Use Topics  . Alcohol use: Yes  . Drug use: No   Medication:    Current Facility-Administered Medications:  .  0.9 %  sodium chloride infusion, , Intravenous, Continuous, Mody, Sital, MD, Last Rate: 75 mL/hr at 01/12/18 1500 .  acetaminophen (TYLENOL) tablet 650 mg, 650 mg, Oral, Q6H PRN, 650 mg at 01/11/18 8299 **OR** acetaminophen (TYLENOL) suppository 650 mg, 650 mg, Rectal, Q6H PRN, Harrie Foreman, MD .  albuterol (PROVENTIL) (2.5 MG/3ML) 0.083% nebulizer solution 2.5 mg, 2.5 mg, Nebulization, Q4H PRN, Harrie Foreman, MD .  allopurinol (ZYLOPRIM) tablet 300 mg, 300 mg, Oral, Daily, Harrie Foreman, MD, 300 mg at 01/12/18 1103 .  aspirin EC tablet 81 mg, 81 mg, Oral, Daily, Harrie Foreman, MD, 81 mg at 01/12/18 1104 .  cholecalciferol (VITAMIN D) tablet 500 Units, 500 Units, Oral, Daily, Harrie Foreman, MD, 500 Units at 01/12/18 1100 .  docusate sodium (COLACE) capsule 100 mg, 100 mg, Oral, BID, Harrie Foreman, MD, 100 mg at 01/12/18 1102 .  enoxaparin (LOVENOX) injection 40 mg, 40 mg, Subcutaneous, Q24H, Harrie Foreman, MD, 40 mg at 01/11/18 2036 .  levothyroxine (SYNTHROID, LEVOTHROID) tablet 75 mcg, 75 mcg, Oral, QAC breakfast, Harrie Foreman,  MD, 75 mcg at 01/12/18 0811 .  lisinopril (PRINIVIL,ZESTRIL) tablet 40 mg, 40 mg, Oral, Daily, Harrie Foreman, MD, 40 mg at 01/12/18 1101 .  methylPREDNISolone sodium succinate (SOLU-MEDROL) 125 mg/2 mL injection 80 mg, 80 mg, Intravenous, Daily, Emmaline Kluver., MD, 80 mg at 01/12/18 1500 .  morphine 2 MG/ML injection 2 mg, 2 mg, Intravenous, Q4H PRN, Harrie Foreman, MD, 2 mg at 01/12/18 0249 .   [START ON 01/13/2018] multivitamin (PROSIGHT) tablet 1 tablet, 1 tablet, Oral, Daily, Shuder, Stephanie, RPH .  ondansetron (ZOFRAN) tablet 4 mg, 4 mg, Oral, Q6H PRN **OR** ondansetron (ZOFRAN) injection 4 mg, 4 mg, Intravenous, Q6H PRN, Harrie Foreman, MD .  protein supplement (PREMIER PROTEIN) liquid, 11 oz, Oral, Q24H, Mody, Sital, MD, 11 oz at 01/12/18 1105 .  tamsulosin (FLOMAX) capsule 0.4 mg, 0.4 mg, Oral, Daily, Harrie Foreman, MD, 0.4 mg at 01/11/18 2036 .  traMADol (ULTRAM) tablet 50 mg, 50 mg, Oral, Q6H PRN, Benjie Karvonen, Sital, MD, 50 mg at 01/11/18 2036   Allergies:  Patient has no known allergies.  Review of Systems: Gen:  Denies  fever, sweats, chills HEENT: Denies blurred vision, double vision. bleeds, sore throat Cvc:  No dizziness, chest pain. Resp:   Denies cough or sputum production, shortness of breath Gi: Denies swallowing difficulty, stomach pain. Gu:  Denies bladder incontinence, burning urine Ext:   No Joint pain, stiffness. Skin: No skin rash,  hives  Endoc:  No polyuria, polydipsia. Psych: No depression, insomnia. Other:  All other systems were reviewed with the patient and were negative other that what is mentioned in the HPI.   Physical Examination:   VS: BP (!) 178/75 (BP Location: Right Arm)   Pulse 90   Temp 98 F (36.7 C)   Resp 18   Ht 6\' 1"  (1.854 m)   Wt 236 lb 4.8 oz (107.2 kg)   SpO2 98%   BMI 31.18 kg/m   General Appearance: No distress  Neuro:without focal findings,  speech normal,  HEENT: PERRLA, EOM intact.   Pulmonary: normal breath sounds, No wheezing.  CardiovascularNormal S1,S2.  No m/r/g.   Abdomen: Benign, Soft, non-tender. Renal:  No costovertebral tenderness  GU:  No performed at this time. Endoc: No evident thyromegaly, no signs of acromegaly. Skin:   warm, no rashes, no ecchymosis  Extremities: normal, no cyanosis, clubbing.  Other findings:    LABORATORY PANEL:   CBC Recent Labs  Lab 01/12/18 0507  WBC 7.0    HGB 11.4*  HCT 33.8*  PLT 223   ------------------------------------------------------------------------------------------------------------------  Chemistries  Recent Labs  Lab 01/11/18 0148 01/12/18 0507  NA 131* 129*  K 4.1 3.9  CL 96* 97*  CO2 26 25  GLUCOSE 124* 120*  BUN 23* 20  CREATININE 1.12 0.84  CALCIUM 8.8* 8.4*  AST 29  --   ALT 26  --   ALKPHOS 47  --   BILITOT 1.0  --    ------------------------------------------------------------------------------------------------------------------  Cardiac Enzymes Recent Labs  Lab 01/11/18 0148  TROPONINI <0.03   ------------------------------------------------------------  RADIOLOGY:  Dg Chest 2 View  Result Date: 01/10/2018 CLINICAL DATA:  Fever cough and shortness of breath. EXAM: CHEST  2 VIEW COMPARISON:  11/23/2017 chest CT. FINDINGS: RIGHT LOWER lobe airspace disease is compatible with pneumonia. Cardiomediastinal silhouette is unremarkable. No pleural effusion or pneumothorax. No acute bony abnormalities identified. IMPRESSION: RIGHT LOWER lobe airspace disease compatible with pneumonia. Radiographic follow-up to resolution is recommended. Electronically Signed  By: Margarette Canada M.D.   On: 01/10/2018 23:30   Ct Chest Wo Contrast  Result Date: 01/11/2018 CLINICAL DATA:  72 year old male with acute respiratory illness. EXAM: CT CHEST WITHOUT CONTRAST TECHNIQUE: Multidetector CT imaging of the chest was performed following the standard protocol without IV contrast. COMPARISON:  Chest radiograph dated 01/10/2018 FINDINGS: Cardiovascular: There is no cardiomegaly or pericardial effusion. Mild atherosclerotic calcification of the thoracic aorta. The central pulmonary arteries are grossly unremarkable on this noncontrast CT. Mediastinum/Nodes: No hilar or mediastinal adenopathy. Esophagus is grossly unremarkable. No mediastinal fluid collection. Lungs/Pleura: Bilateral patchy ground-glass and nodular densities involving  the lower lobes, right middle lobe, lingula and to a lesser degree the upper lobes most consistent with multifocal pneumonia. Clinical correlation and follow-up to resolution recommended. There is no pleural effusion or pneumothorax. The central airways are patent. Upper Abdomen: Cholecystectomy.  Mild fatty liver. Musculoskeletal: Degenerative changes of the spine. No acute osseous pathology. IMPRESSION: Multifocal pneumonia. Electronically Signed   By: Anner Crete M.D.   On: 01/11/2018 03:10       Thank  you for the consultation and for allowing [facility] to assist in the care of your patient. Our recommendations are noted above.  Please contact us if we can be of further service.   Marda Stalker, MD.  Board Certified in Internal Medicine, Pulmonary Medicine, [place], and Sleep Medicine.  [facility] Office Number: [phone]  Patricia Pesa, M.D.  Merton Border, M.D  01/12/2018

## 2018-01-12 NOTE — Plan of Care (Signed)
Pt. Progressing. Pt aware of plan of care and new medication given following Dr. Jefm Bryant recommendations

## 2018-01-12 NOTE — Evaluation (Addendum)
Objective Swallowing Evaluation: Type of Study: MBS-Modified Barium Swallow Study   Patient Details  Name: Logan Murphy MRN: [id number] Date of Birth: 09-26-1946  Today's Date: 01/12/2018 Time: SLP Start Time (ACUTE ONLY): 0955 -SLP Stop Time (ACUTE ONLY): 1050  SLP Time Calculation (min) (ACUTE ONLY): 55 min   Past Medical History:  Past Medical History:  Diagnosis Date  . Gout   . Hypertension   . Thyroid disease    Past Surgical History:  Past Surgical History:  Procedure Laterality Date  . CHOLECYSTECTOMY    . TOTAL HIP ARTHROPLASTY Right    HPI: Patient with past medical history of Ascending aortic aneurysm, CAD, hypothyroidism, hypertension and gout presents to the emergency department complaining of severe cough post choking on a "Junior Mint" candy.  The patient has had recurrent pneumonia twice in the last 4 months.  He is completed 2 courses of Levaquin and steroids with a CT of the chest to confirm resolution of pneumonia after the last course.  However he has developed multifocal pneumonia yet again and is on his third round of Levaquin.  In addition, the patient described a minor aspiration-like event when he ate Candy while reclining during a movie - he also stated he felt he could have fallen asleep.  In the emergency department CT of his chest confirmed multi lobar bilateral pneumonia.  Oxygen saturations were low to normal on room air but the patient's exercise tolerance was dramatically decreased.  He admits to severe fatigue and shortness of breath at times w/ general movements around the house; also stated he is much less active over these last few months vs his lifestyle before. Pt reported he ate a regular diet at home and denied any difficulty swallowing at meals. He did admit to eating reclined in his recliner at home.    Subjective: pt awake, verbally conversive and engaged easily w/ SLP, staff. Followed all instructions.     Assessment / Plan /  Recommendation  CHL IP CLINICAL IMPRESSIONS 01/12/2018  Clinical Impression Pt appears to present w/ adequate Oropharyngeal phase swallow function w/ NO oropharyngeal phase dysphagia noted during this evaluation. Pt exhibited an Oral phase WFL for bolus management, control/cohesion, timely A-P transfer, and oral clearing post swallow. Pt w/ native dentition for mastication; no OM weakness. During the Pharyngeal phase, pt exhibited timely pharyngeal swallow initiation w/ appropriate airway closure - NO laryngeal penetration or aspiration noted. Pharyngeal clearing of bolus material post swallow was appropriate. Bolus motility through the Cervical Esophagus (viewable) appeared [facility]; unable to view the Esophagus more distally d/t shoulders. Pt fed self. Video viewed and discussed w/ pt. Education given on general aspiration precautions including to NOT recline while eating/drinking; small bites/sips. No further skilled ST Services indicated at this time.  SLP Visit Diagnosis Dysphagia, unspecified (R13.10)  Attention and concentration deficit following none  Frontal lobe and executive function deficit following none  Impact on safety and function (No Data)      CHL IP TREATMENT RECOMMENDATION 01/12/2018  Treatment Recommendations No treatment recommended at this time     Prognosis 01/12/2018  Prognosis for Safe Diet Advancement Good  Barriers to Reach Goals (No Data)  Barriers/Prognosis Comment --    CHL IP DIET RECOMMENDATION 01/12/2018  SLP Diet Recommendations Regular solids;Thin liquid  Liquid Administration via Cup;Straw  Medication Administration Whole meds with liquid  Compensations Minimize environmental distractions;Slow rate;Small sips/bites;Follow solids with liquid  Postural Changes Remain semi-upright after after feeds/meals (Comment);Seated upright at 90 degrees  CHL IP OTHER RECOMMENDATIONS 01/12/2018  Recommended Consults (No Data)  Oral Care Recommendations Oral care  BID;Patient independent with oral care  Other Recommendations (No Data)      CHL IP FOLLOW UP RECOMMENDATIONS 01/12/2018  Follow up Recommendations None      CHL IP FREQUENCY AND DURATION 01/12/2018  Speech Therapy Frequency (ACUTE ONLY) (No Data)  Treatment Duration (No Data)           CHL IP ORAL PHASE 01/12/2018  Oral Phase WFL  Oral - Pudding Teaspoon --  Oral - Pudding Cup NT  Oral - Honey Teaspoon --  Oral - Honey Cup NT  Oral - Nectar Teaspoon --  Oral - Nectar Cup 1 trial  Oral - Nectar Straw --  Oral - Thin Teaspoon --  Oral - Thin Cup 6 trials; multiple sips x1  Oral - Thin Straw --  Oral - Puree 3 trials  Oral - Mech Soft --  Oral - Regular 3 trials  Oral - Multi-Consistency --  Oral - Pill NT  Oral Phase - Comment --    CHL IP PHARYNGEAL PHASE 01/12/2018  Pharyngeal Phase WFL  Pharyngeal- Pudding Teaspoon --  Pharyngeal --  Pharyngeal- Pudding Cup NT  Pharyngeal --  Pharyngeal- Honey Teaspoon --  Pharyngeal --  Pharyngeal- Honey Cup NT  Pharyngeal --  Pharyngeal- Nectar Teaspoon --  Pharyngeal --  Pharyngeal- Nectar Cup 1 trial  Pharyngeal --  Pharyngeal- Nectar Straw --  Pharyngeal --  Pharyngeal- Thin Teaspoon --  Pharyngeal --  Pharyngeal- Thin Cup 6 trials; multiple sips x1  Pharyngeal --  Pharyngeal- Thin Straw --  Pharyngeal --  Pharyngeal- Puree 3 trials  Pharyngeal --  Pharyngeal- Mechanical Soft --  Pharyngeal --  Pharyngeal- Regular 3 trials  Pharyngeal --  Pharyngeal- Multi-consistency --  Pharyngeal --  Pharyngeal- Pill NT  Pharyngeal --  Pharyngeal Comment --     CHL IP CERVICAL ESOPHAGEAL PHASE 01/12/2018  Cervical Esophageal Phase WFL  Pudding Teaspoon --  Pudding Cup --  Honey Teaspoon --  Honey Cup --  Nectar Teaspoon --  Nectar Cup --  Nectar Straw --  Thin Teaspoon --  Thin Cup --  Thin Straw --  Puree --  Mechanical Soft --  Regular --  Multi-consistency --  Pill --  Cervical Esophageal Comment --     No flowsheet data found.         Orinda Kenner, MS, CCC-SLP Stamatia Masri 01/12/2018, 10:51 AM

## 2018-01-12 NOTE — Plan of Care (Signed)
Pt had difficulty getting his pain under control along with a decrease in his mobility. MD made aware. Morphine seemed to work better than Tramadol to manage his pain. Pt helped with mobility by using ambulatory aid (walker) and one person assist with gait belt. Pt sat up in the chair because he could not get comfortable in the bed. He sat up for a few hours then he was assisted back to bed.  Education: Knowledge of General Education information will improve 01/12/2018 0428 - Progressing by Jeffie Pollock, RN   Respiratory: Ability to maintain adequate ventilation will improve 01/12/2018 0428 - Progressing by Jeffie Pollock, RN Ability to maintain a clear airway will improve 01/12/2018 0428 - Progressing by Jeffie Pollock, RN

## 2018-01-12 NOTE — Progress Notes (Signed)
[facility] NAME: Logan Murphy    MR#:  [id number]  DATE OF BIRTH:  Jul 13, 1946  SUBJECTIVE:   c/o pain all over mostly musculoskeletal. Denies cough or shortness of breath.  REVIEW OF SYSTEMS:    Review of Systems  Constitutional: Negative for fever, chills weight loss HENT: Negative for ear pain, nosebleeds, congestion, facial swelling, rhinorrhea, neck pain, neck stiffness and ear discharge.   Respiratory: Negative for cough, shortness of breath, wheezing  Cardiovascular: Negative for chest pain, palpitations and leg swelling.  Gastrointestinal: Negative for heartburn, abdominal pain, vomiting, diarrhea or consitpation Genitourinary: Negative for dysuria, urgency, frequency, hematuria Musculoskeletal: Pain predominantly musculoskeletal in nature   Neurological: Negative for dizziness, seizures, syncope, focal weakness,  numbness and headaches.  Hematological: Does not bruise/bleed easily.  Psychiatric/Behavioral: Negative for hallucinations, confusion, dysphoric mood    Tolerating Diet: yes      DRUG ALLERGIES:  No Known Allergies  VITALS:  Blood pressure 130/65, pulse 91, temperature 98.1 F (36.7 C), temperature source Oral, resp. rate 18, height _0  (1.854 m), weight 107.2 kg (236 lb 4.8 oz), SpO2 98 %.  PHYSICAL EXAMINATION:  Constitutional: Appears well-developed and well-nourished. No distress. HENT: Normocephalic. Marland Kitchen Oropharynx is clear and moist.  Eyes: Conjunctivae and EOM are normal. PERRLA, no scleral icterus.  Neck: Normal ROM. Neck supple. No JVD. No tracheal deviation. CVS: RRR, S1/S2 +, no murmurs, no gallops, no carotid bruit.  Pulmonary: Effort and breath sounds normal, no stridor, rhonchi, wheezes, rales.  Abdominal: Soft. BS +,  no distension, tenderness, rebound or guarding.  Musculoskeletal: Normal range of motion. No edema and no tenderness.  Neuro: Alert. CN 2-12 grossly intact. No focal  deficits. Skin: Skin is warm and dry. No rash noted. Psychiatric: Normal mood and affect.      LABORATORY PANEL:   CBC Recent Labs  Lab 01/12/18 0507  WBC 7.0  HGB 11.4*  HCT 33.8*  PLT 223   ------------------------------------------------------------------------------------------------------------------  Chemistries  Recent Labs  Lab 01/11/18 0148 01/12/18 0507  NA 131* 129*  K 4.1 3.9  CL 96* 97*  CO2 26 25  GLUCOSE 124* 120*  BUN 23* 20  CREATININE 1.12 0.84  CALCIUM 8.8* 8.4*  AST 29  --   ALT 26  --   ALKPHOS 47  --   BILITOT 1.0  --    ------------------------------------------------------------------------------------------------------------------  Cardiac Enzymes Recent Labs  Lab 01/11/18 0148  TROPONINI <0.03   ------------------------------------------------------------------------------------------------------------------  RADIOLOGY:  Dg Chest 2 View  Result Date: 01/10/2018 CLINICAL DATA:  Fever cough and shortness of breath. EXAM: CHEST  2 VIEW COMPARISON:  11/23/2017 chest CT. FINDINGS: RIGHT LOWER lobe airspace disease is compatible with pneumonia. Cardiomediastinal silhouette is unremarkable. No pleural effusion or pneumothorax. No acute bony abnormalities identified. IMPRESSION: RIGHT LOWER lobe airspace disease compatible with pneumonia. Radiographic follow-up to resolution is recommended. Electronically Signed   By: Margarette Canada M.D.   On: 01/10/2018 23:30   Ct Chest Wo Contrast  Result Date: 01/11/2018 CLINICAL DATA:  72 year old male with acute respiratory illness. EXAM: CT CHEST WITHOUT CONTRAST TECHNIQUE: Multidetector CT imaging of the chest was performed following the standard protocol without IV contrast. COMPARISON:  Chest radiograph dated 01/10/2018 FINDINGS: Cardiovascular: There is no cardiomegaly or pericardial effusion. Mild atherosclerotic calcification of the thoracic aorta. The central pulmonary arteries are grossly  unremarkable on this noncontrast CT. Mediastinum/Nodes: No hilar or mediastinal adenopathy. Esophagus is grossly unremarkable. No mediastinal fluid collection. Lungs/Pleura:  Bilateral patchy ground-glass and nodular densities involving the lower lobes, right middle lobe, lingula and to a lesser degree the upper lobes most consistent with multifocal pneumonia. Clinical correlation and follow-up to resolution recommended. There is no pleural effusion or pneumothorax. The central airways are patent. Upper Abdomen: Cholecystectomy.  Mild fatty liver. Musculoskeletal: Degenerative changes of the spine. No acute osseous pathology. IMPRESSION: Multifocal pneumonia. Electronically Signed   By: Anner Crete M.D.   On: 01/11/2018 03:10     ASSESSMENT AND PLAN:   72 year old male with hypothyroidism and hypertension who presents with cough.  1, HCAP: CT scan shows multifocal pneumonia. Patient's symptoms completely convincing for pneumonia. There is question whether he has hypersensitive pneumonitis or other autoimmune process. Continue cefepime for now. Stop vancomycin Procalcitonin is normal which maybe indicative of no acute bacterial infection  Appreciate pulmonary consultation.  2. Joint and musculoskeletal pain with slightly elevated ESR and CRP CK was normal Follow up on ANA and hypersensitivity panel Obtain rheumatology consultation Consider Lyme panel   3. Hyponatremia: TSH slightly elevated I will check T4 Start normal saline and follow sodium level Stop HCTZ 4. BPH: Continue tamsulosin  5. History of gout: Continue allopurinol  6. Hypothyroidism: Continue current dose of Synthroid and check T4  7. Essential hypertension: Continue lisinopril   Management plans discussed with the patient and he is in agreement.  CODE STATUS: full  TOTAL TIME TAKING CARE OF THIS PATIENT: 29 minutes.   Patient does not need further physical therapy upon discharge  POSSIBLE D/C tomorrow,  DEPENDING ON CLINICAL CONDITION.   Leor Whyte M.D on 01/12/2018 at 10:57 AM  Between 7am to 6pm - Pager - [phone] After 6pm go to [email or web address] - password [email or web address] [facility]  Office  [phone]  CC: Primary care physician; Tracie Harrier, MD  Note: This dictation was prepared with Dragon dictation along with smaller phrase technology. Any transcriptional errors that result from this process are unintentional.

## 2018-01-12 NOTE — H&P (View-Only) (Signed)
[facility] Consultation      Assessment and Plan:  Bilateral multifocal pneumonia with groundglass changes of uncertain etiology, there is a broad differential for these findings including organizing pneumonia, secondary to rheumatological disease, as well as hypersensitivity pneumonitis, versus infectious etiology. --D/w patient, will plan for bronchoscopy, tentatively 2/27 afternoon.  --Will make NPO after MN.    Date: 01/12/2018  MRN# [id number] Logan Murphy November 14, 1946  Referring Physician: Dr. Raul Del for possible bronchoscopy   Logan Murphy is a 72 y.o. old male seen in consultation for chief complaint of:    Chief Complaint  Patient presents with  . Pneumonia    HPI:   Patient is a 72 year old male. Patient has been having severe joint pains for the past several months.  He also been noted to have recurrent course of pneumonia in the past few months with multiple courses of antibiotics and steroids he had a rheumatoid factor and sedimentation rate which were checked which were 24 and 32 respectively.  He was then referred to rheumatology and further workup was ordered He presented to the emergency room on 2/24 after a possible aspiration event where he started coughing profusely after trying to eat a Junior mint.  He continued to feel tired and fatigued wears previously he used to be active with golfing. He underwent a CT of the chest which showed another course of pneumonia for which she was admitted to the hospital  Images personally reviewed, CT chest 01/11/18;  this showed multifocal multi lobar infiltrates, predominantly bronchocentric in the right middle lobe and lingula.  These infiltrates were probably upper lobe, bronchocentric CT chest from 09/28/17, the infiltrates then completely resolved on subsequent CT chest on 1/7.  PMHX:   Past Medical History:  Diagnosis Date  . Gout   . Hypertension   . Thyroid disease    Surgical Hx:  Past  Surgical History:  Procedure Laterality Date  . CHOLECYSTECTOMY    . TOTAL HIP ARTHROPLASTY Right    Family Hx:  Family History  Problem Relation Age of Onset  . Hypertension Other    Social Hx:   Social History   Tobacco Use  . Smoking status: Never Smoker  . Smokeless tobacco: Never Used  Substance Use Topics  . Alcohol use: Yes  . Drug use: No   Medication:    Current Facility-Administered Medications:  .  0.9 %  sodium chloride infusion, , Intravenous, Continuous, Mody, Sital, MD, Last Rate: 75 mL/hr at 01/12/18 1500 .  acetaminophen (TYLENOL) tablet 650 mg, 650 mg, Oral, Q6H PRN, 650 mg at 01/11/18 1025 **OR** acetaminophen (TYLENOL) suppository 650 mg, 650 mg, Rectal, Q6H PRN, Harrie Foreman, MD .  albuterol (PROVENTIL) (2.5 MG/3ML) 0.083% nebulizer solution 2.5 mg, 2.5 mg, Nebulization, Q4H PRN, Harrie Foreman, MD .  allopurinol (ZYLOPRIM) tablet 300 mg, 300 mg, Oral, Daily, Harrie Foreman, MD, 300 mg at 01/12/18 1103 .  aspirin EC tablet 81 mg, 81 mg, Oral, Daily, Harrie Foreman, MD, 81 mg at 01/12/18 1104 .  cholecalciferol (VITAMIN D) tablet 500 Units, 500 Units, Oral, Daily, Harrie Foreman, MD, 500 Units at 01/12/18 1100 .  docusate sodium (COLACE) capsule 100 mg, 100 mg, Oral, BID, Harrie Foreman, MD, 100 mg at 01/12/18 1102 .  enoxaparin (LOVENOX) injection 40 mg, 40 mg, Subcutaneous, Q24H, Harrie Foreman, MD, 40 mg at 01/11/18 2036 .  levothyroxine (SYNTHROID, LEVOTHROID) tablet 75 mcg, 75 mcg, Oral, QAC breakfast, Harrie Foreman,  MD, 75 mcg at 01/12/18 0811 .  lisinopril (PRINIVIL,ZESTRIL) tablet 40 mg, 40 mg, Oral, Daily, Harrie Foreman, MD, 40 mg at 01/12/18 1101 .  methylPREDNISolone sodium succinate (SOLU-MEDROL) 125 mg/2 mL injection 80 mg, 80 mg, Intravenous, Daily, Emmaline Kluver., MD, 80 mg at 01/12/18 1500 .  morphine 2 MG/ML injection 2 mg, 2 mg, Intravenous, Q4H PRN, Harrie Foreman, MD, 2 mg at 01/12/18 0249 .   [START ON 01/13/2018] multivitamin (PROSIGHT) tablet 1 tablet, 1 tablet, Oral, Daily, Shuder, Stephanie, RPH .  ondansetron (ZOFRAN) tablet 4 mg, 4 mg, Oral, Q6H PRN **OR** ondansetron (ZOFRAN) injection 4 mg, 4 mg, Intravenous, Q6H PRN, Harrie Foreman, MD .  protein supplement (PREMIER PROTEIN) liquid, 11 oz, Oral, Q24H, Mody, Sital, MD, 11 oz at 01/12/18 1105 .  tamsulosin (FLOMAX) capsule 0.4 mg, 0.4 mg, Oral, Daily, Harrie Foreman, MD, 0.4 mg at 01/11/18 2036 .  traMADol (ULTRAM) tablet 50 mg, 50 mg, Oral, Q6H PRN, Benjie Karvonen, Sital, MD, 50 mg at 01/11/18 2036   Allergies:  Patient has no known allergies.  Review of Systems: Gen:  Denies  fever, sweats, chills HEENT: Denies blurred vision, double vision. bleeds, sore throat Cvc:  No dizziness, chest pain. Resp:   Denies cough or sputum production, shortness of breath Gi: Denies swallowing difficulty, stomach pain. Gu:  Denies bladder incontinence, burning urine Ext:   No Joint pain, stiffness. Skin: No skin rash,  hives  Endoc:  No polyuria, polydipsia. Psych: No depression, insomnia. Other:  All other systems were reviewed with the patient and were negative other that what is mentioned in the HPI.   Physical Examination:   VS: BP (!) 178/75 (BP Location: Right Arm)   Pulse 90   Temp 98 F (36.7 C)   Resp 18   Ht 6\' 1"  (1.854 m)   Wt 236 lb 4.8 oz (107.2 kg)   SpO2 98%   BMI 31.18 kg/m   General Appearance: No distress  Neuro:without focal findings,  speech normal,  HEENT: PERRLA, EOM intact.   Pulmonary: normal breath sounds, No wheezing.  CardiovascularNormal S1,S2.  No m/r/g.   Abdomen: Benign, Soft, non-tender. Renal:  No costovertebral tenderness  GU:  No performed at this time. Endoc: No evident thyromegaly, no signs of acromegaly. Skin:   warm, no rashes, no ecchymosis  Extremities: normal, no cyanosis, clubbing.  Other findings:    LABORATORY PANEL:   CBC Recent Labs  Lab 01/12/18 0507  WBC 7.0    HGB 11.4*  HCT 33.8*  PLT 223   ------------------------------------------------------------------------------------------------------------------  Chemistries  Recent Labs  Lab 01/11/18 0148 01/12/18 0507  NA 131* 129*  K 4.1 3.9  CL 96* 97*  CO2 26 25  GLUCOSE 124* 120*  BUN 23* 20  CREATININE 1.12 0.84  CALCIUM 8.8* 8.4*  AST 29  --   ALT 26  --   ALKPHOS 47  --   BILITOT 1.0  --    ------------------------------------------------------------------------------------------------------------------  Cardiac Enzymes Recent Labs  Lab 01/11/18 0148  TROPONINI <0.03   ------------------------------------------------------------  RADIOLOGY:  Dg Chest 2 View  Result Date: 01/10/2018 CLINICAL DATA:  Fever cough and shortness of breath. EXAM: CHEST  2 VIEW COMPARISON:  11/23/2017 chest CT. FINDINGS: RIGHT LOWER lobe airspace disease is compatible with pneumonia. Cardiomediastinal silhouette is unremarkable. No pleural effusion or pneumothorax. No acute bony abnormalities identified. IMPRESSION: RIGHT LOWER lobe airspace disease compatible with pneumonia. Radiographic follow-up to resolution is recommended. Electronically Signed  By: Margarette Canada M.D.   On: 01/10/2018 23:30   Ct Chest Wo Contrast  Result Date: 01/11/2018 CLINICAL DATA:  72 year old male with acute respiratory illness. EXAM: CT CHEST WITHOUT CONTRAST TECHNIQUE: Multidetector CT imaging of the chest was performed following the standard protocol without IV contrast. COMPARISON:  Chest radiograph dated 01/10/2018 FINDINGS: Cardiovascular: There is no cardiomegaly or pericardial effusion. Mild atherosclerotic calcification of the thoracic aorta. The central pulmonary arteries are grossly unremarkable on this noncontrast CT. Mediastinum/Nodes: No hilar or mediastinal adenopathy. Esophagus is grossly unremarkable. No mediastinal fluid collection. Lungs/Pleura: Bilateral patchy ground-glass and nodular densities involving  the lower lobes, right middle lobe, lingula and to a lesser degree the upper lobes most consistent with multifocal pneumonia. Clinical correlation and follow-up to resolution recommended. There is no pleural effusion or pneumothorax. The central airways are patent. Upper Abdomen: Cholecystectomy.  Mild fatty liver. Musculoskeletal: Degenerative changes of the spine. No acute osseous pathology. IMPRESSION: Multifocal pneumonia. Electronically Signed   By: Anner Crete M.D.   On: 01/11/2018 03:10       Thank  you for the consultation and for allowing [facility] to assist in the care of your patient. Our recommendations are noted above.  Please contact us if we can be of further service.   Marda Stalker, MD.  Board Certified in Internal Medicine, Pulmonary Medicine, [place], and Sleep Medicine.  [facility] Office Number: [phone]  Patricia Pesa, M.D.  Merton Border, M.D  01/12/2018

## 2018-01-12 NOTE — Progress Notes (Signed)
Date: 01/12/2018,   MRN# [id number] Logan Murphy 1946-02-11 Code Status:     Code Status Orders  (From admission, onward)        Start     Ordered   01/11/18 0543  Full code  Continuous     01/11/18 0542    Code Status History    Date Active Date Inactive Code Status Order ID Comments User Context   02/03/2017 22:56 02/04/2017 21:18 Full Code [id number]  Bettey Costa, MD Inpatient    Advance Directive Documentation     Most Recent Value  Type of Advance Directive  Healthcare Power of Attorney, Living will  Pre-existing out of facility DNR order (yellow form or pink MOST form)  No data  "MOST" Form in Place?  No data      HPI: See previous note. No new complaints.   PMHX:   Past Medical History:  Diagnosis Date  . Gout   . Hypertension   . Thyroid disease    Surgical Hx:  Past Surgical History:  Procedure Laterality Date  . CHOLECYSTECTOMY    . TOTAL HIP ARTHROPLASTY Right    Family Hx:  Family History  Problem Relation Age of Onset  . Hypertension Other    Social Hx:   Social History   Tobacco Use  . Smoking status: Never Smoker  . Smokeless tobacco: Never Used  Substance Use Topics  . Alcohol use: Yes  . Drug use: No   Medication:    Home Medication:    Current Medication: @CURMEDTAB @   Allergies:  Patient has no known allergies.  Review of Systems: Gen:  Denies  fever, sweats, chills HEENT: Denies blurred vision, double vision, ear pain, eye pain, hearing loss, nose bleeds, sore throat Cvc:  No dizziness, chest pain or heaviness Resp:  Less sob, no   Gi: Denies swallowing difficulty, stomach pain, nausea or vomiting, diarrhea, constipation, bowel incontinence Gu:  Denies bladder incontinence, burning urine Ext:   No Joint pain, stiffness or swelling Skin: No skin rash, easy bruising or bleeding or hives Endoc:  No polyuria, polydipsia , polyphagia or weight change Psych: No depression, insomnia or hallucinations  Other:  All other systems  negative  Physical Examination:   VS: BP (!) 178/75 (BP Location: Right Arm)   Pulse 90   Temp 98 F (36.7 C)   Resp 18   Ht 6\' 1"  (1.854 m)   Wt 107.2 kg (236 lb 4.8 oz)   SpO2 98%   BMI 31.18 kg/m   General Appearance: No distress, sitting up in the chair  Neuro: without focal findings, mental status, speech normal, alert and oriented, cranial nerves 2-12 intact, reflexes normal and symmetric, sensation grossly normal  NECK: Supple, jvd, nodes  HEENT: PERRLA, EOM intact, no ptosis, no other lesions noticed, Mallampati: Pulmonary:.No wheezing, No rales     Cardiovascular:  Normal S1,S2.  No m/r/g.  Abdominal aorta pulsation normal.    Abdomen:Benign, Soft, non-tender, No masses, hepatosplenomegaly, No lymphadenopathy Endoc: No evident thyromegaly, no signs of acromegaly or Cushing features Skin:   warm, no rashes, no ecchymosis rare peticihae on the lower extremity Extremities: normal, no cyanosis, clubbing, no edema, warm with normal capillary refill.    Labs results:   Recent Labs    01/11/18 0148 01/12/18 0507  HGB 12.5* 11.4*  HCT 37.6* 33.8*  MCV 87.3 87.9  WBC 13.3* 7.0  BUN 23* 20  CREATININE 1.12 0.84  GLUCOSE 124* 120*  CALCIUM 8.8* 8.4*  ,  CLINICAL DATA:  72 year old male with acute respiratory illness.  EXAM: CT CHEST WITHOUT CONTRAST  TECHNIQUE: Multidetector CT imaging of the chest was performed following the standard protocol without IV contrast.  COMPARISON:  Chest radiograph dated 01/10/2018  FINDINGS: Cardiovascular: There is no cardiomegaly or pericardial effusion. Mild atherosclerotic calcification of the thoracic aorta. The central pulmonary arteries are grossly unremarkable on this noncontrast CT.  Mediastinum/Nodes: No hilar or mediastinal adenopathy. Esophagus is grossly unremarkable. No mediastinal fluid collection.  Lungs/Pleura: Bilateral patchy ground-glass and nodular densities involving the lower lobes, right middle  lobe, lingula and to a lesser degree the upper lobes most consistent with multifocal pneumonia. Clinical correlation and follow-up to resolution recommended. There is no pleural effusion or pneumothorax. The central airways are patent.  Upper Abdomen: Cholecystectomy.  Mild fatty liver.  Musculoskeletal: Degenerative changes of the spine. No acute osseous pathology.  IMPRESSION: Multifocal pneumonia.   Electronically Signed   By: Anner Crete M.D.   On: 01/11/2018 03:10     Assessment and Plan: This is a pleasant 72 yr old male, non smoker,  who since 10/18 has been plaqued  With pneumonia like present, multiple admission, various rounds of antibiotics nad prednisone. He seem to think he felt on prednisone. At present I am leaning away from recurrent pneumonia, though part of the differential. He is looking more like a non infectious cause: ?? Connective tissue disease/vasculitis. ?? Hypersensitivity pneumonitis ( mold and dust when they acquired their new home). Does not look like pulmonary edema. Microscopic hematuria. ?? Renal vasculitis. Renal consulted -ace, hypersensitivity pneumonitis, anca  -continue present antibiotics for now -will add steroids, per rheum -bronchoscopy  team to see in the am   C/o increase fatigue ? Related to the above, ?? Sleep apnea vs other causes -home sleep study in the near future -following      I have personally obtained a history, examined the patient, evaluated laboratory and imaging results, formulated the assessment and plan and placed orders.  The Patient requires high complexity decision making for assessment and support, frequent evaluation and titration of therapies, application of advanced monitoring technologies and extensive interpretation of multiple databases.   Rini Moffit,M.D. Pulmonary & Critical care Medicine [facility]

## 2018-01-12 NOTE — Consult Note (Addendum)
Reason for Consult: Joint pain.  Abnormal chest x-ray.    Referring Physician: Dr. Doren Custard   HPI: 72 year old white male.  Complicated story.  Hip replacement in the summer.  Did well.  In October he felt poorly.  Fatigue.  Migrating muscle and joint pain.  Elbows shoulders knees.  Went on a trip.  Developed a rash on his legs.  Petechial.  All the way up to his knees.  Saw physician.  Was told it was vasculitis.  He elevate his legs.  Improved some.  Felt poorly during the trip with a good deal of fatigue and migrating muscle and joint pain.  Return.  Never had fever.  Was found to have pulmonary infiltrates.  Thought to be legionnaire's.  Placed on antibiotics and prednisone Since then he has had a stuttering course.  3 rounds of antibiotics and prednisone.  Recently felt like he was having migrating pain in elbow knee and shoulder.  Never had fever.  Repeat chest x-ray showed infiltrate bilaterally.  CT showed ground glass infiltrates.  Was admitted.  Low sodium.  Minimal leukocytosis.  Elevated sed rate CRP.  Creatinine 1.1.  6-30 red cells.  30 mg per dose deciliter protein.  He is been placed on antibiotics but not steroids.  Raised the question of hypersensitivity pneumonitis.  Did have mold ducts changed in his apartment.  Not much better.  Hurting a good deal today.  Sed rate 52.  Recent rheumatoid factor 24 , CCP antibodies 61.  Never had hand swelling.  PMH: Gout.  Hypertension.  Hypothyroid.  SURGICAL HISTORY: Right hip replacement.  Cholecystectomy.  Small bowel obstruction not operated  Family History: Negative for inflammatory diseases  Social History: No significant cigarettes or alcohol.  No illicit drugs  Allergies: No Known Allergies  Medications:  Scheduled: . allopurinol  300 mg Oral Daily  . aspirin EC  81 mg Oral Daily  . cholecalciferol  500 Units Oral Daily  . docusate sodium  100 mg Oral BID  . enoxaparin (LOVENOX) injection  40 mg Subcutaneous Q24H   . levothyroxine  75 mcg Oral QAC breakfast  . lisinopril  40 mg Oral Daily  . methylPREDNISolone (SOLU-MEDROL) injection  80 mg Intravenous Daily  . protein supplement shake  11 oz Oral Q24H  . tamsulosin  0.4 mg Oral Daily        ROS: No chest pain.  No visual symptoms.  No numbness or tingling.  No change in hearing.  No headache.  No abdominal pain.  No diarrhea.   PHYSICAL EXAM: Blood pressure 120/67, pulse 91, temperature 98.1 F (36.7 C), temperature source Oral, resp. rate 18, height 6\' 1"  (1.854 m), weight 107.2 kg (236 lb 4.8 oz), SpO2 98 %. Pleasant male.  Does not want to sit because of pain in his hips and shoulders.  Skin with a nonblanching petechial rash both ankles.  No facial rash.  No discoid lesions.  Sclera clear.  Crackles both bases.  No loud P2.  No significant murmur.  No visceromegaly.  1+ edema.  Musculoskeletal: Decreased range of motion of both shoulders with painful arc.  Elbows without synovitis.  Wrists MCPs without synovitis.  Both hips have pain with motion.  No significant knee synovitis.  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  Pending FANA  Fatty  liver.   History of small bowel obstruction.   Status post hip replacement.  Recommendations: I ordered IV Solu-Medrol ANCA abs, anti-basement membrane antibodies,  rec nephrology consult, Consider bronch and lung biopsy  Emmaline Kluver 01/12/2018, 12:28 PM

## 2018-01-13 ENCOUNTER — Inpatient Hospital Stay: Payer: Medicare Other

## 2018-01-13 ENCOUNTER — Encounter
Admission: EM | Disposition: A | Discharge status: Home / Self Care | Payer: Self-pay | Source: Home / Self Care | Attending: Internal Medicine

## 2018-01-13 HISTORY — PX: VIDEO BRONCHOSCOPY: SHX5072

## 2018-01-13 LAB — ANA COMPREHENSIVE PANEL
Centromere Ab Screen: <0.2 AI (ref 0.0–0.9)
ENA SM Ab Ser-aCnc: <0.2 AI (ref 0.0–0.9)
Ribonucleic Protein: <0.2 AI (ref 0.0–0.9)
SSA (Ro) (ENA) Antibody, IgG: <0.2 AI (ref 0.0–0.9)
SSB (La) (ENA) Antibody, IgG: <0.2 AI (ref 0.0–0.9)
ds DNA Ab: 1 IU/mL (ref 0–9)

## 2018-01-13 LAB — BASIC METABOLIC PANEL
ANION GAP: 9 (ref 5–15)
BUN: 19 mg/dL (ref 6–20)
CHLORIDE: 100 mmol/L — AB (ref 101–111)
CO2: 24 mmol/L (ref 22–32)
Calcium: 8.9 mg/dL (ref 8.9–10.3)
Creatinine, Ser: 0.75 mg/dL (ref 0.61–1.24)
GFR calc non Af Amer: >60 mL/min (ref >60)
GLUCOSE: 119 mg/dL — AB (ref 65–99)
Potassium: 3.8 mmol/L (ref 3.5–5.1)
Sodium: 133 mmol/L — ABNORMAL LOW (ref 135–145)

## 2018-01-13 LAB — C4 COMPLEMENT: Complement C4, Body Fluid: 27 mg/dL (ref 14–44)

## 2018-01-13 LAB — MPO/PR-3 (ANCA) ANTIBODIES
ANCA PROTEINASE 3: 94.6 U/mL — AB (ref 0.0–3.5)
Myeloperoxidase Abs: <9 U/mL (ref 0.0–9.0)

## 2018-01-13 LAB — C3 COMPLEMENT: C3 COMPLEMENT: 163 mg/dL (ref 82–167)

## 2018-01-13 LAB — T4: T4, Total: 6.6 ug/dL (ref 4.5–12.0)

## 2018-01-13 LAB — ANGIOTENSIN CONVERTING ENZYME: Angiotensin-Converting Enzyme: <15 U/L (ref 14–82)

## 2018-01-13 LAB — GLOMERULAR BASEMENT MEMBRANE ANTIBODIES: GBM Ab: 3 units (ref 0–20)

## 2018-01-13 SURGERY — BRONCHOSCOPY, WITH FLUOROSCOPY
Anesthesia: Moderate Sedation | Laterality: Bilateral

## 2018-01-13 MED ORDER — MIDAZOLAM HCL 5 MG/5ML IJ SOLN
INTRAMUSCULAR | Status: AC | PRN
  Administered 2018-01-13: 1 mg via INTRAVENOUS
  Administered 2018-01-13 (×2): 2 mg via INTRAVENOUS
  Administered 2018-01-13: 1 mg via INTRAVENOUS

## 2018-01-13 MED ORDER — BUTAMBEN-TETRACAINE-BENZOCAINE 2-2-14 % EX AERO
INHALATION_SPRAY | CUTANEOUS | Status: AC
  Filled 2018-01-13: qty 5

## 2018-01-13 MED ORDER — MIDAZOLAM HCL 5 MG/5ML IJ SOLN
INTRAMUSCULAR | Status: AC
  Filled 2018-01-13: qty 10

## 2018-01-13 MED ORDER — FENTANYL CITRATE (PF) 100 MCG/2ML IJ SOLN
INTRAMUSCULAR | Status: AC
  Filled 2018-01-13: qty 4

## 2018-01-13 MED ORDER — MIDAZOLAM HCL 2 MG/2ML IJ SOLN
INTRAMUSCULAR | Status: AC | PRN
  Administered 2018-01-13: 2 mg via INTRAVENOUS

## 2018-01-13 MED ORDER — FENTANYL CITRATE (PF) 100 MCG/2ML IJ SOLN
INTRAMUSCULAR | Status: AC | PRN
  Administered 2018-01-13 (×4): 25 ug via INTRAVENOUS

## 2018-01-13 MED ORDER — OCUVITE-LUTEIN PO CAPS
1.0000 | ORAL_CAPSULE | Freq: Every day | ORAL | Status: DC
  Administered 2018-01-14: 08:00:00 1 via ORAL
  Filled 2018-01-13 (×2): qty 1

## 2018-01-13 MED ORDER — LIDOCAINE HCL 2 % EX GEL
1.0000 "application " | Freq: Once | CUTANEOUS | Status: DC
  Filled 2018-01-13: qty 5

## 2018-01-13 MED ORDER — PHENYLEPHRINE HCL 0.25 % NA SOLN
1.0000 | Freq: Four times a day (QID) | NASAL | Status: DC | PRN
  Filled 2018-01-13: qty 15

## 2018-01-13 MED ORDER — BUTAMBEN-TETRACAINE-BENZOCAINE 2-2-14 % EX AERO
1.0000 | INHALATION_SPRAY | Freq: Once | CUTANEOUS | Status: DC
  Filled 2018-01-13: qty 20

## 2018-01-13 NOTE — Care Management Important Message (Signed)
Important Message  Patient Details  Name: Logan Murphy MRN: [id number] Date of Birth: January 02, 1946   Medicare Important Message Given:  Yes    Shelbie Ammons, RN 01/13/2018, 9:29 AM

## 2018-01-13 NOTE — Interval H&P Note (Signed)
History and Physical Interval Note:  01/13/2018 4:08 PM  Logan Murphy  has presented today for surgery, with the diagnosis of pneumonia  The various methods of treatment have been discussed with the patient and family. After consideration of risks, benefits and other options for treatment, the patient has consented to  Procedure(s): VIDEO BRONCHOSCOPY WITH FLUORO (Bilateral) as a surgical intervention .  The patient's history has been reviewed, patient examined, no change in status, stable for surgery.  I have reviewed the patient's chart and labs.  Questions were answered to the patient's satisfaction.     [name]

## 2018-01-13 NOTE — Progress Notes (Signed)
[facility] [place] NAME: Logan Murphy    MR#:  [id number]  DATE OF BIRTH:  October 26, 1946  SUBJECTIVE:   Patient doing okay this morning. Going for bronchoscopy this morning. REVIEW OF SYSTEMS:    Review of Systems  Constitutional: Negative for fever, chills weight loss HENT: Negative for ear pain, nosebleeds, congestion, facial swelling, rhinorrhea, neck pain, neck stiffness and ear discharge.   Respiratory: Negative for cough, shortness of breath, wheezing  Cardiovascular: Negative for chest pain, palpitations and leg swelling.  Gastrointestinal: Negative for heartburn, abdominal pain, vomiting, diarrhea or consitpation Genitourinary: Negative for dysuria, urgency, frequency, hematuria Musculoskeletal: Pain predominantly musculoskeletal in nature   Neurological: Negative for dizziness, seizures, syncope, focal weakness,  numbness and headaches.  Hematological: Does not bruise/bleed easily.  Psychiatric/Behavioral: Negative for hallucinations, confusion, dysphoric mood    Tolerating Diet: Nothing by mouth for bronchoscopy      DRUG ALLERGIES:  No Known Allergies  VITALS:  Blood pressure (!) 152/79, pulse 84, temperature 97.8 F (36.6 C), temperature source Oral, resp. rate 16, height '6\' 1"'  (1.854 m), weight 107.2 kg (236 lb 4.8 oz), SpO2 96 %.  PHYSICAL EXAMINATION:  Constitutional: Appears well-developed and well-nourished. No distress. HENT: Normocephalic. Marland Kitchen Oropharynx is clear and moist.  Eyes: Conjunctivae and EOM are normal. PERRLA, no scleral icterus.  Neck: Normal ROM. Neck supple. No JVD. No tracheal deviation. CVS: RRR, S1/S2 +, no murmurs, no gallops, no carotid bruit.  Pulmonary: Effort and breath sounds normal, no stridor, rhonchi, wheezes, rales.  Abdominal: Soft. BS +,  no distension, tenderness, rebound or guarding.  Musculoskeletal: Normal range of motion. No edema and no tenderness.  Neuro: Alert. CN 2-12 grossly  intact. No focal deficits. Skin: Skin is warm and dry. Lower extremity petechia noted  Psychiatric: Normal mood and affect.      LABORATORY PANEL:   CBC Recent Labs  Lab 01/12/18 0507  WBC 7.0  HGB 11.4*  HCT 33.8*  PLT 223   ------------------------------------------------------------------------------------------------------------------  Chemistries  Recent Labs  Lab 01/11/18 0148 01/12/18 0507  NA 131* 129*  K 4.1 3.9  CL 96* 97*  CO2 26 25  GLUCOSE 124* 120*  BUN 23* 20  CREATININE 1.12 0.84  CALCIUM 8.8* 8.4*  AST 29  --   ALT 26  --   ALKPHOS 47  --   BILITOT 1.0  --    ------------------------------------------------------------------------------------------------------------------  Cardiac Enzymes Recent Labs  Lab 01/11/18 0148  TROPONINI <0.03   ------------------------------------------------------------------------------------------------------------------  RADIOLOGY:  No results found.   ASSESSMENT AND PLAN:   72 year old male with hypothyroidism and hypertension who presents with cough.  1, bilateral multifocal infiltrating groundglass changes of uncertain etiology: It does not appear the patient has pneumonia Antibiotics have been discontinued Pro calcitonin level is normal Plan for bronchoscopy today for evaluation of hypersensitivty pneumonitis versus infectious etiology versus vasculitis    2. Joint and musculoskeletal pain with slightly elevated ESR and CRP CK was normal Working diagnosis of vasculitis  Patient evaluated by rheumatology, pulmonary and nephrology  Plan for bronchoscopy today  Plan for outpatient kidney biopsy next week  Follow-up on ANA and ANCA Continue steroids as per recommendations by Dr. Jefm Bryant 3. Hyponatremia: Improved  Continue to hold HCTZ  4. BPH: Continue tamsulosin  5. History of gout: Continue allopurinol  6. Hypothyroidism: Continue current dose of Synthroid   TSH elevated but T4 is  normal  7. Essential hypertension: Continue lisinopril  D/w dr Abigail Butts  Management plans discussed with the patient and he is in agreement.  CODE STATUS: full  TOTAL TIME TAKING CARE OF THIS PATIENT: 28 minutes.   Patient does not need further physical therapy upon discharge  POSSIBLE D/C tomorrow, DEPENDING ON CLINICAL CONDITION.   Tekisha Darcey M.D on 01/13/2018 at 9:03 AM  Between 7am to 6pm - Pager - [phone] After 6pm go to [email or web address] - password [email or web address] [facility]  Office  [phone]  CC: Primary care physician; Tracie Harrier, MD  Note: This dictation was prepared with Dragon dictation along with smaller phrase technology. Any transcriptional errors that result from this process are unintentional.

## 2018-01-13 NOTE — Procedures (Addendum)
  [facility]            Bronchoscopy Note   FINDINGS/SUMMARY:   -Severe erythema and moderate mucosal edema consistent with severe chronic bronchitis.  Significant mucosal secretions were suctioned. 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  -Fluoroscopic guided right middle lobe transbronchial biopsies, transbronchial cytology brushings, and right middle lobe bronchoalveolar lavage. -Fluoroscopic guided lingular transbronchial brushings taken, and lingular bronchoalveolar lavage. --Bronchoalveolar lavage sample sent for culture, AFB, viral culture, fungal culture.  Indication: Pneumonia The patient (or their representative) was informed of the risks (including but not limited to bleeding, infection, respiratory failure, lung injury, tooth/oral injury) and benefits of the procedure and gave consent, see chart.   Pre-op diagnosis: pneumonia Post-op diagnosis: same Estimated blood loss: 5cc Total anesthetic time: 30 minutes, I was present for the duration of anesthesia.  Medications for procedure: Fentanyl 100 mcg, Versed 8 mg.  Procedure description:  After obtaining informed consent, timeout was called to confirm the patient and procedure.  The right and left nares were checked for patency, the left nares appeared to be more patent, therefore this was anesthetized with topical lidocaine.  Flexible fiberoptic bronchoscope was passed via the left nares after adequate sedation was achieved.  Posterior pharynx was visualized, normal movements of the vocal cords was visualized.  Topical lidocaine was applied here.  Bronchoscope was then passed to the trachea, additional lidocaine was applied to the right and left mainstem bronchi.  An anatomical tunnel was undertaken, all segments were visualized there was no obvious abnormalities. There was significant amount of mucus  which was suctioned.  In the left mainstem bronchus medially, there was hyperplasia of the tracheal rings seen forceps biopsies endobronchial were taken at the left mainstem bronchus x2.  Before doing this topical epinephrine was applied to this area.  There was minimal bleeding.  The bronchoscope was then taken to the right middle lobe after applying topical epinephrine the right mainstem bronchus.  The bronchoscope was wedged into the medial segment of the right middle lobe bronchus, transbronchial biopsies were taken x8 with good returns under fluoroscopic guidance.  Transbronchial cytology brushing was then taken x2 under fluoroscopic guidance.  Bronchoalveolar lavage was then taken of the right middle lobe. The bronchoscope was then taken to the lingula, medial segment.  A different protected brush catheter was passed into the lingula, brushings obtained x2 under fluoroscopic guidance transbronchially.  Bronchoalveolar lavage was then obtained. As adequate specimens had been obtained at that time, the bronchoscope was then removed, the patient was taken to recovery. Fluoroscopy was used to visualize the right apex, no pneumothorax was visualized.  Condition post procedure: Stable   Complications: None noted.     Marda Stalker, MD.  Board Certified in Internal Medicine, Pulmonary Medicine, [place], and Sleep Medicine.  [facility] Office Number: [phone]  Patricia Pesa, M.D.  Cheral Marker, M.D  01/13/2018

## 2018-01-13 NOTE — Consult Note (Signed)
[facility]  CONSULT NOTE    Date: 01/13/2018                  Patient Name:  Logan Murphy  MRN: [id number]  DOB: [birth year]  Age / Sex: 72 y.o., male         PCP: Tracie Harrier, MD                 Service Requesting Consult: Dr. Benjie Karvonen                 Reason for Consult: Hematuria            History of Present Illness: Logan Murphy is a 72 y.o. white male with hypothyroidism, hypertension, gout, who was admitted to [facility] on 01/11/2018 for HCAP (healthcare-associated pneumonia) [J18.9] Dyspnea, unspecified type [R06.00]  Patient states since October he has been having weakness, aches and arthralgias. Treated with prednisone for concerning pneumonia.   Patient started to have hematuria in December. Mild rash.   Wife at bedside.      Medications: Outpatient medications: Medications Prior to Admission  Medication Sig Dispense Refill Last Dose  . albuterol (PROVENTIL HFA;VENTOLIN HFA) 108 (90 Base) MCG/ACT inhaler Inhale 1-2 puffs into the lungs every 6 (six) hours as needed for wheezing or shortness of breath.    prn  . allopurinol (ZYLOPRIM) 300 MG tablet Take 300 mg by mouth daily.   01/10/2018 at Unknown time  . aspirin EC 81 MG tablet Take 81 mg by mouth daily.   01/10/2018 at Unknown time  . Cholecalciferol 400 units CAPS Take 400 Units by mouth daily.    01/10/2018 at Unknown time  . hydrochlorothiazide (MICROZIDE) 12.5 MG capsule Take 12.5 mg by mouth daily.   01/10/2018 at Unknown time  . [EXPIRED] levofloxacin (LEVAQUIN) 750 MG tablet Take 750 mg by mouth daily.   01/10/2018 at Unknown time  . levothyroxine (SYNTHROID, LEVOTHROID) 75 MCG tablet Take 75 mcg by mouth daily.   01/10/2018 at Unknown time  . lisinopril (PRINIVIL,ZESTRIL) 40 MG tablet Take 40 mg by mouth daily.   01/10/2018 at Unknown time  . tamsulosin (FLOMAX) 0.4 MG CAPS capsule Take 0.4 mg by mouth daily.   01/10/2018 at Unknown time  . ondansetron (ZOFRAN) 4 MG tablet Take 1  tablet (4 mg total) by mouth every 8 (eight) hours as needed for nausea or vomiting. (Patient not taking: Reported on 01/11/2018) 20 tablet 0 Not Taking at Unknown time  . traMADol (ULTRAM) 50 MG tablet Take 1 tablet (50 mg total) by mouth every 6 (six) hours as needed. (Patient not taking: Reported on 01/11/2018) 15 tablet 0 Not Taking at Unknown time    Current medications: Current Facility-Administered Medications  Medication Dose Route Frequency Provider Last Rate Last Dose  . acetaminophen (TYLENOL) tablet 650 mg  650 mg Oral Q6H PRN Harrie Foreman, MD   650 mg at 01/11/18 3267   Or  . acetaminophen (TYLENOL) suppository 650 mg  650 mg Rectal Q6H PRN Harrie Foreman, MD      . albuterol (PROVENTIL) (2.5 MG/3ML) 0.083% nebulizer solution 2.5 mg  2.5 mg Nebulization Q4H PRN Harrie Foreman, MD      . allopurinol (ZYLOPRIM) tablet 300 mg  300 mg Oral Daily Harrie Foreman, MD   300 mg at 01/12/18 1103  . cholecalciferol (VITAMIN D) tablet 500 Units  500 Units Oral Daily Harrie Foreman, MD   500 Units at 01/12/18  1100  . docusate sodium (COLACE) capsule 100 mg  100 mg Oral BID Harrie Foreman, MD   100 mg at 01/12/18 2125  . [START ON 01/14/2018] enoxaparin (LOVENOX) injection 40 mg  40 mg Subcutaneous Q24H [name], MD      . fentaNYL (SUBLIMAZE) 100 MCG/2ML injection           . levothyroxine (SYNTHROID, LEVOTHROID) tablet 75 mcg  75 mcg Oral QAC breakfast Harrie Foreman, MD   75 mcg at 01/12/18 [phone]  . lisinopril (PRINIVIL,ZESTRIL) tablet 40 mg  40 mg Oral Daily Harrie Foreman, MD   40 mg at 01/12/18 1101  . methylPREDNISolone sodium succinate (SOLU-MEDROL) 125 mg/2 mL injection 80 mg  80 mg Intravenous Daily Emmaline Kluver., MD   80 mg at 01/13/18 0802  . midazolam (VERSED) 5 MG/5ML injection           . morphine 2 MG/ML injection 2 mg  2 mg Intravenous Q4H PRN Harrie Foreman, MD   2 mg at 01/12/18 0249  . multivitamin-lutein (OCUVITE-LUTEIN)  capsule 1 capsule  1 capsule Oral Daily Mody, Sital, MD      . ondansetron (ZOFRAN) tablet 4 mg  4 mg Oral Q6H PRN Harrie Foreman, MD       Or  . ondansetron [facility]) injection 4 mg  4 mg Intravenous Q6H PRN Harrie Foreman, MD      . protein supplement (PREMIER PROTEIN) liquid  11 oz Oral Q24H Bettey Costa, MD   11 oz at 01/12/18 1105  . tamsulosin (FLOMAX) capsule 0.4 mg  0.4 mg Oral Daily Harrie Foreman, MD   0.4 mg at 01/12/18 2125  . traMADol (ULTRAM) tablet 50 mg  50 mg Oral Q6H PRN Bettey Costa, MD   50 mg at 01/11/18 2036      Allergies: No Known Allergies    Past Medical History: Past Medical History:  Diagnosis Date  . Gout   . Hypertension   . Thyroid disease      Past Surgical History: Past Surgical History:  Procedure Laterality Date  . CHOLECYSTECTOMY    . TOTAL HIP ARTHROPLASTY Right      Family History: Family History  Problem Relation Age of Onset  . Hypertension Other      Social History: Social History   Socioeconomic History  . Marital status: Married    Spouse name: Not on file  . Number of children: Not on file  . Years of education: Not on file  . Highest education level: Not on file  Social Needs  . Financial resource strain: Not on file  . Food insecurity - worry: Not on file  . Food insecurity - inability: Not on file  . Transportation needs - medical: Not on file  . Transportation needs - non-medical: Not on file  Occupational History  . Not on file  Tobacco Use  . Smoking status: Never Smoker  . Smokeless tobacco: Never Used  Substance and Sexual Activity  . Alcohol use: Yes  . Drug use: No  . Sexual activity: Not on file  Other Topics Concern  . Not on file  Social History Narrative  . Not on file     Review of Systems: Review of Systems  Constitutional: Positive for chills, diaphoresis, fever, malaise/fatigue and weight loss.  HENT: Negative for congestion, ear discharge, ear pain, hearing loss, nosebleeds,  sinus pain, sore throat and tinnitus.   Eyes: Negative.  Negative for blurred  vision, double vision, photophobia, pain, discharge and redness.  Respiratory: Positive for cough, sputum production, shortness of breath and wheezing. Negative for hemoptysis and stridor.   Cardiovascular: Positive for leg swelling. Negative for chest pain, palpitations, orthopnea, claudication and PND.  Gastrointestinal: Negative.  Negative for abdominal pain, blood in stool, constipation, diarrhea, heartburn, melena, nausea and vomiting.  Genitourinary: Negative.  Negative for dysuria, flank pain, frequency, hematuria and urgency.  Musculoskeletal: Negative.  Negative for back pain, falls, joint pain, myalgias and neck pain.  Skin: Positive for itching and rash.  Neurological: Positive for weakness. Negative for dizziness, tingling, tremors, sensory change, speech change, focal weakness, seizures, loss of consciousness and headaches.  Endo/Heme/Allergies: Negative for environmental allergies and polydipsia. Does not bruise/bleed easily.  Psychiatric/Behavioral: Negative.  Negative for depression, hallucinations, memory loss, substance abuse and suicidal ideas. The patient is not nervous/anxious and does not have insomnia.     Vital Signs: Blood pressure 138/65, pulse 85, temperature 97.7 F (36.5 C), temperature source Oral, resp. rate 18, height 6\' 1"  (1.854 m), weight 107.2 kg (236 lb 4.8 oz), SpO2 97 %.  Weight trends: Filed Weights   01/11/18 0539 01/12/18 0500 01/13/18 0310  Weight: 109.6 kg (241 lb 10 oz) 107.2 kg (236 lb 4.8 oz) 107.2 kg (236 lb 4.8 oz)    Physical Exam: General: NAD,   Head: Normocephalic, atraumatic. Moist oral mucosal membranes  Eyes: Anicteric, PERRL  Neck: Supple, trachea midline  Lungs:  Clear to auscultation  Heart: Regular rate and rhythm  Abdomen:  Soft, nontender,   Extremities: trace peripheral edema.  Neurologic: Nonfocal, moving all four extremities  Skin: Small  petechial lesions    Lab results: Basic Metabolic Panel: Recent Labs  Lab 01/11/18 0148 01/12/18 0507 01/13/18 0829  NA 131* 129* 133*  K 4.1 3.9 3.8  CL 96* 97* 100*  CO2 26 25 24   GLUCOSE 124* 120* 119*  BUN 23* 20 19  CREATININE 1.12 0.84 0.75  CALCIUM 8.8* 8.4* 8.9    Liver Function Tests: Recent Labs  Lab 01/11/18 0148  AST 29  ALT 26  ALKPHOS 47  BILITOT 1.0  PROT 7.5  ALBUMIN 3.6   Recent Labs  Lab 01/11/18 0148  LIPASE 34   No results for input(s): AMMONIA in the last 168 hours.  CBC: Recent Labs  Lab 01/11/18 0148 01/12/18 0507  WBC 13.3* 7.0  NEUTROABS 12.0*  --   HGB 12.5* 11.4*  HCT 37.6* 33.8*  MCV 87.3 87.9  PLT 257 223    Cardiac Enzymes: Recent Labs  Lab 01/11/18 0148 01/12/18 0507  CKTOTAL 112 63  TROPONINI <0.03  --     BNP: Invalid input(s): POCBNP  CBG: No results for input(s): GLUCAP in the last 168 hours.  Microbiology: Results for orders placed or performed during the hospital encounter of 01/11/18  Blood Culture (routine x 2)     Status: None (Preliminary result)   Collection Time: 01/11/18  1:48 AM  Result Value Ref Range Status   Specimen Description BLOOD LEFT FOREARM  Final   Special Requests   Final    BOTTLES DRAWN AEROBIC AND ANAEROBIC Blood Culture results may not be optimal due to an excessive volume of blood received in culture bottles   Culture   Final    NO GROWTH 2 DAYS Performed at [facility], [address] [place], [place] [address]    Report Status PENDING  Incomplete  Urine culture     Status: None  Collection Time: 01/11/18  1:48 AM  Result Value Ref Range Status   Specimen Description   Final    URINE, RANDOM Performed at [facility], [address], [place], [place] [address]    Special Requests   Final    NONE Performed at [facility], [address], [place], [place] [address]    Culture   Final    NO GROWTH Performed at [place], [place] [address], [place], [place] [address]    Report Status 01/12/2018 FINAL  Final  Blood Culture (routine x 2)     Status: None (Preliminary result)   Collection Time: 01/11/18  2:04 AM  Result Value Ref Range Status   Specimen Description BLOOD RIGHT ANTECUBITAL  Final   Special Requests   Final    BOTTLES DRAWN AEROBIC AND ANAEROBIC Blood Culture adequate volume   Culture   Final    NO GROWTH 2 DAYS Performed at [facility], [address] [place], [place] [address]    Report Status PENDING  Incomplete  Respiratory Panel by PCR     Status: None   Collection Time: 01/11/18  4:27 AM  Result Value Ref Range Status   Adenovirus NOT DETECTED NOT DETECTED Final   Coronavirus 229E NOT DETECTED NOT DETECTED Final   Coronavirus HKU1 NOT DETECTED NOT DETECTED Final   Coronavirus NL63 NOT DETECTED NOT DETECTED Final   Coronavirus OC43 NOT DETECTED NOT DETECTED Final   Metapneumovirus NOT DETECTED NOT DETECTED Final   Rhinovirus / Enterovirus NOT DETECTED NOT DETECTED Final   Influenza A NOT DETECTED NOT DETECTED Final   Influenza B NOT DETECTED NOT DETECTED Final   Parainfluenza Virus 1 NOT DETECTED NOT DETECTED Final   Parainfluenza Virus 2 NOT DETECTED NOT DETECTED Final   Parainfluenza Virus 3 NOT DETECTED NOT DETECTED Final   Parainfluenza Virus 4 NOT DETECTED NOT DETECTED Final   Respiratory Syncytial Virus NOT DETECTED NOT DETECTED Final   Bordetella pertussis NOT DETECTED NOT DETECTED Final   Chlamydophila pneumoniae NOT DETECTED NOT DETECTED Final   Mycoplasma pneumoniae NOT DETECTED NOT DETECTED Final    Comment: Performed at [facility], [place] [address] [place], [place] [address]  MRSA PCR Screening     Status: None   Collection Time: 01/11/18  5:48 AM  Result Value Ref Range Status   MRSA by PCR NEGATIVE NEGATIVE Final    Comment:        The GeneXpert MRSA Assay (FDA approved for NASAL specimens only), is one component of a comprehensive MRSA  colonization surveillance program. It is not intended to diagnose MRSA infection nor to guide or monitor treatment for MRSA infections. Performed at [facility], [address] [place],  [address]     Coagulation Studies: No results for input(s): LABPROT, INR in the last 72 hours.  Urinalysis: Recent Labs    01/11/18 0148  COLORURINE YELLOW*  LABSPEC 1.023  PHURINE 5.0  GLUCOSEU NEGATIVE  HGBUR MODERATE*  BILIRUBINUR NEGATIVE  KETONESUR NEGATIVE  PROTEINUR 30*  NITRITE NEGATIVE  LEUKOCYTESUR NEGATIVE      Imaging: Dg Chest 1 View  Result Date: 01/13/2018 CLINICAL DATA:  Status post bronchoscopy and lung biopsy. EXAM: CHEST 1 VIEW COMPARISON:  Chest x-ray dated January 10, 2018. FINDINGS: The heart size and mediastinal contours are within normal limits. Normal pulmonary vascularity. Unchanged airspace disease within both mid to lower lungs. No pleural effusion or pneumothorax. No acute osseous abnormality. IMPRESSION:  1. No pneumothorax status post bronchoscopy and lung biopsy. 2. Unchanged bilateral mid to lower lung airspace disease. Electronically Signed   By: Titus Dubin M.D.   On: 01/13/2018 16:27   Dg C-arm 1-60 Min-no Report  Result Date: 01/13/2018 Fluoroscopy was utilized by the requesting physician.  No radiographic interpretation.      Assessment & Plan: Mr. BENCE TRAPP is a 72 y.o. white male with hypothyroidism, hypertension, gout, who was admitted to [facility] on 01/11/2018 for HCAP (healthcare-associated pneumonia) [J18.9] Dyspnea, unspecified type [R06.00]  1. Hematuria 2. Proteinuria 3. Skin lesions 4. 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  Pending lung biopsy.  Will plan on a renal biopsy hopefully during this admission   LOS: 2 Rosalie Gelpi 2/27/20198:00 PM

## 2018-01-13 NOTE — Consult Note (Signed)
Rheumatology follow-up Muscles and joints remarkably better after single dose of IV steroids Still has particular rash in lower extremities For bronchoscopy today. Has seen nephrology. Possible renal biopsy next week Normal FANA , Complement. Pending ANCA, GBM antibodies  Exam: Afebrile. Petechial rash in his ankles. Shoulders hips move well.  Impression: Connective tissue disease with pulmonary infiltrates, leukocytoclastic vasculitis, a large joint inflammatory arthritis, hematuria.  Pending bronchoscopy. Pending renal biopsy. If he is discharged before renal bx,he could go out on 60mg   Prednisone, NO taper until data, biopsy reports are in

## 2018-01-14 ENCOUNTER — Encounter: Payer: Self-pay | Admitting: Internal Medicine

## 2018-01-14 LAB — BASIC METABOLIC PANEL
Anion gap: 9 (ref 5–15)
BUN: 21 mg/dL — ABNORMAL HIGH (ref 6–20)
CHLORIDE: 104 mmol/L (ref 101–111)
CO2: 25 mmol/L (ref 22–32)
CREATININE: 0.8 mg/dL (ref 0.61–1.24)
Calcium: 8.5 mg/dL — ABNORMAL LOW (ref 8.9–10.3)
GFR calc Af Amer: >60 mL/min (ref >60)
Glucose, Bld: 101 mg/dL — ABNORMAL HIGH (ref 65–99)
Potassium: 3.4 mmol/L — ABNORMAL LOW (ref 3.5–5.1)
SODIUM: 138 mmol/L (ref 135–145)

## 2018-01-14 LAB — ANCA TITERS: Atypical P-ANCA titer: <1:20 {titer}

## 2018-01-14 MED ORDER — AZITHROMYCIN 250 MG PO TABS: ORAL_TABLET | ORAL | 0 refills | Status: DC

## 2018-01-14 MED ORDER — PREMIER PROTEIN SHAKE: 11.0000 [oz_av] | ORAL | 0 refills | Status: AC

## 2018-01-14 MED ORDER — POTASSIUM CHLORIDE CRYS ER 20 MEQ PO TBCR: 40.0000 meq | EXTENDED_RELEASE_TABLET | Freq: Once | ORAL | Status: DC

## 2018-01-14 MED ORDER — PANTOPRAZOLE SODIUM 40 MG PO TBEC: 40.0000 mg | DELAYED_RELEASE_TABLET | Freq: Every day | ORAL | 0 refills | Status: DC

## 2018-01-14 MED ORDER — PREDNISONE 10 MG PO TABS: 60.0000 mg | ORAL_TABLET | Freq: Every day | ORAL | 0 refills | Status: DC

## 2018-01-14 NOTE — Discharge Summary (Signed)
[facility] [place] NAME: Logan Murphy    MR#:  [id number]  DATE OF BIRTH:  1946/07/22  DATE OF ADMISSION:  01/11/2018 ADMITTING PHYSICIAN: Harrie Foreman, MD  DATE OF DISCHARGE: 01/14/2018  PRIMARY CARE PHYSICIAN: Tracie Harrier, MD    ADMISSION DIAGNOSIS:  HCAP (healthcare-associated pneumonia) [J18.9] Dyspnea, unspecified type [R06.00]  DISCHARGE DIAGNOSIS:  Active Problems:   HCAP (healthcare-associated pneumonia)   SECONDARY DIAGNOSIS:   Past Medical History:  Diagnosis Date  . Gout   . Hypertension   . Thyroid disease     HOSPITAL COURSE:   72 year old male with hypothyroidism and hypertension who presents with cough.  1, bilateral multifocal infiltrating groundglass changes of uncertain etiology: Patient underwent bronchoscopy which shows chronic bronchitis. Biopsies were taken. Underlying working diagnosis is possible vasculitis. Patient was evaluated by pulmonary, rheumatology as well as nephrology. Patient will follow up with pulmonary for bronchoscopy biopsy results in 1 week.    2. 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 Jefm Bryant in the interim has recommended prednisone 60 mg daily. He is also discharged with PPI daily.    3. Hyponatremia:  This is improved.   4. BPH: Continue tamsulosin  5. History of gout: Continue allopurinol  6. Hypothyroidism: Continue current dose of Synthroid   TSH elevated but T4 is normal  7. Essential hypertension: Continue lisinoprilAnd HCTZ with close monitoring of sodium level  8. Hypokalemia: This was repleted prior to discharge.   DISCHARGE CONDITIONS AND DIET:   Stable  Regular diet  CONSULTS OBTAINED:  Treatment Team:  Erby Pian, MD Emmaline Kluver., MD Lavonia Dana, MD  DRUG ALLERGIES:  No Known Allergies  DISCHARGE MEDICATIONS:   Allergies as of 01/14/2018   No Known Allergies     Medication List    STOP taking these medications   levofloxacin 750 MG tablet Commonly known as:  LEVAQUIN   ondansetron 4 MG tablet Commonly known as:  ZOFRAN   traMADol 50 MG tablet Commonly known as:  ULTRAM     TAKE these medications   albuterol 108 (90 Base) MCG/ACT inhaler Commonly known as:  PROVENTIL HFA;VENTOLIN HFA Inhale 1-2 puffs into the lungs every 6 (six) hours as needed for wheezing or shortness of breath.   allopurinol 300 MG tablet Commonly known as:  ZYLOPRIM Take 300 mg by mouth daily.   aspirin EC 81 MG tablet Take 81 mg by mouth daily.   azithromycin 250 MG tablet Commonly known as:  ZITHROMAX Take 1 tablet daily for 6 days for bronchitis   Cholecalciferol 400 units Caps Take 400 Units by mouth daily.   hydrochlorothiazide 12.5 MG capsule Commonly known as:  MICROZIDE Take 12.5 mg by mouth daily.   levothyroxine 75 MCG tablet Commonly known as:  SYNTHROID, LEVOTHROID Take 75 mcg by mouth daily.   lisinopril 40 MG tablet Commonly known as:  PRINIVIL,ZESTRIL Take 40 mg by mouth daily.   pantoprazole 40 MG tablet Commonly known as:  PROTONIX Take 1 tablet (40 mg total) by mouth daily.   predniSONE 10 MG tablet Commonly known as:  DELTASONE Take 6 tablets (60 mg total) by mouth daily with breakfast.   protein supplement shake Liqd Commonly known as:  PREMIER PROTEIN Take 325  mLs (11 oz total) by mouth daily for 27 days. Start taking on:  01/15/2018   tamsulosin 0.4 MG Caps capsule Commonly known as:  FLOMAX Take 0.4 mg by mouth daily.         Today   CHIEF COMPLAINT:   Patient doing well this morning. No acute issues. Patient is ready for discharge.   VITAL SIGNS:  Blood pressure (!) 171/86, pulse 68, temperature 97.8 F (36.6 C), temperature source  Oral, resp. rate 18, height '6\' 1"'  (1.854 m), weight 108.1 kg (238 lb 4.8 oz), SpO2 94 %.   REVIEW OF SYSTEMS:  Review of Systems  Constitutional: Negative.  Negative for chills, fever and malaise/fatigue.  HENT: Negative.  Negative for ear discharge, ear pain, hearing loss, nosebleeds and sore throat.   Eyes: Negative.  Negative for blurred vision and pain.  Respiratory: Negative.  Negative for cough, hemoptysis, shortness of breath and wheezing.   Cardiovascular: Negative.  Negative for chest pain, palpitations and leg swelling.  Gastrointestinal: Negative.  Negative for abdominal pain, blood in stool, diarrhea, nausea and vomiting.  Genitourinary: Negative.  Negative for dysuria.  Musculoskeletal: Negative.  Negative for back pain.  Skin: Negative.   Neurological: Negative for dizziness, tremors, speech change, focal weakness, seizures and headaches.  Endo/Heme/Allergies: Negative.  Does not bruise/bleed easily.  Psychiatric/Behavioral: Negative.  Negative for depression, hallucinations and suicidal ideas.     PHYSICAL EXAMINATION:  GENERAL:  72 y.o.-year-old patient lying in the bed with no acute distress.  NECK:  Supple, no jugular venous distention. No thyroid enlargement, no tenderness.  LUNGS: Normal breath sounds bilaterally, no wheezing, rales,rhonchi  No use of accessory muscles of respiration.  CARDIOVASCULAR: S1, S2 normal. No murmurs, rubs, or gallops.  ABDOMEN: Soft, non-tender, non-distended. Bowel sounds present. No organomegaly or mass.  EXTREMITIES: No pedal edema, cyanosis, or clubbing.  PSYCHIATRIC: The patient is alert and oriented x 3.  SKIN: No obvious rash, lesion, or ulcer.   DATA REVIEW:   CBC Recent Labs  Lab 01/12/18 0507  WBC 7.0  HGB 11.4*  HCT 33.8*  PLT 223    Chemistries  Recent Labs  Lab 01/11/18 0148  01/14/18 0340  NA 131*   < > 138  K 4.1   < > 3.4*  CL 96*   < > 104  CO2 26   < > 25  GLUCOSE 124*   < > 101*  BUN 23*   < > 21*   CREATININE 1.12   < > 0.80  CALCIUM 8.8*   < > 8.5*  AST 29  --   --   ALT 26  --   --   ALKPHOS 47  --   --   BILITOT 1.0  --   --    < > = values in this interval not displayed.    Cardiac Enzymes Recent Labs  Lab 01/11/18 0148  TROPONINI <0.03    Microbiology Results  '@[email or web address]' @  RADIOLOGY:  Dg Chest 1 View  Result Date: 01/13/2018 CLINICAL DATA:  Status post bronchoscopy and lung biopsy. EXAM: CHEST 1 VIEW COMPARISON:  Chest x-ray dated January 10, 2018. FINDINGS: The heart size and mediastinal contours are within normal limits. Normal pulmonary vascularity. Unchanged airspace disease within both mid to lower lungs. No pleural effusion or pneumothorax. No acute osseous abnormality. IMPRESSION: 1. No pneumothorax status post bronchoscopy and lung biopsy. 2. Unchanged bilateral mid to lower lung airspace disease. Electronically Signed   By: Orville Govern.D.  On: 01/13/2018 16:27   Dg C-arm 1-60 Min-no Report  Result Date: 01/13/2018 Fluoroscopy was utilized by the requesting physician.  No radiographic interpretation.      Allergies as of 01/14/2018   No Known Allergies     Medication List    STOP taking these medications   levofloxacin 750 MG tablet Commonly known as:  LEVAQUIN   ondansetron 4 MG tablet Commonly known as:  ZOFRAN   traMADol 50 MG tablet Commonly known as:  ULTRAM     TAKE these medications   albuterol 108 (90 Base) MCG/ACT inhaler Commonly known as:  PROVENTIL HFA;VENTOLIN HFA Inhale 1-2 puffs into the lungs every 6 (six) hours as needed for wheezing or shortness of breath.   allopurinol 300 MG tablet Commonly known as:  ZYLOPRIM Take 300 mg by mouth daily.   aspirin EC 81 MG tablet Take 81 mg by mouth daily.   azithromycin 250 MG tablet Commonly known as:  ZITHROMAX Take 1 tablet daily for 6 days for bronchitis   Cholecalciferol 400 units Caps Take 400 Units by mouth daily.   hydrochlorothiazide 12.5 MG  capsule Commonly known as:  MICROZIDE Take 12.5 mg by mouth daily.   levothyroxine 75 MCG tablet Commonly known as:  SYNTHROID, LEVOTHROID Take 75 mcg by mouth daily.   lisinopril 40 MG tablet Commonly known as:  PRINIVIL,ZESTRIL Take 40 mg by mouth daily.   pantoprazole 40 MG tablet Commonly known as:  PROTONIX Take 1 tablet (40 mg total) by mouth daily.   predniSONE 10 MG tablet Commonly known as:  DELTASONE Take 6 tablets (60 mg total) by mouth daily with breakfast.   protein supplement shake Liqd Commonly known as:  PREMIER PROTEIN Take 325 mLs (11 oz total) by mouth daily for 27 days. Start taking on:  01/15/2018   tamsulosin 0.4 MG Caps capsule Commonly known as:  FLOMAX Take 0.4 mg by mouth daily.         Management plans discussed with the patient and he is in agreement. Stable for discharge home  Patient should follow up with pcp  CODE STATUS:     Code Status Orders  (From admission, onward)        Start     Ordered   01/11/18 0543  Full code  Continuous     01/11/18 0542    Code Status History    Date Active Date Inactive Code Status Order ID Comments User Context   02/03/2017 22:56 02/04/2017 21:18 Full Code [id number]  Bettey Costa, MD Inpatient    Advance Directive Documentation     Most Recent Value  Type of Advance Directive  Healthcare Power of Attorney, Living will  Pre-existing out of facility DNR order (yellow form or pink MOST form)  No data  "MOST" Form in Place?  No data      TOTAL TIME TAKING CARE OF THIS PATIENT: 38 minutes.    Note: This dictation was prepared with Dragon dictation along with smaller phrase technology. Any transcriptional errors that result from this process are unintentional.  Ryleigh Buenger M.D on 01/14/2018 at 8:31 AM  Between 7am to 6pm - Pager - [phone] After 6pm go to [email or web address] - password [email or web address]  Office  [phone]  CC: Primary care physician; Tracie Harrier,  MD

## 2018-01-15 LAB — CYTOLOGY - NON PAP

## 2018-01-15 LAB — ACID FAST SMEAR (AFB, MYCOBACTERIA)
Acid Fast Smear: NEGATIVE
Acid Fast Smear: NEGATIVE

## 2018-01-15 LAB — SURGICAL PATHOLOGY

## 2018-01-15 LAB — ACID FAST SMEAR (AFB)

## 2018-01-15 LAB — HYPERSENSITIVITY PNEUMONITIS
A. Pullulans Abs: NEGATIVE
A.Fumigatus #1 Abs: NEGATIVE
MICROPOLYSPORA FAENI IGG: NEGATIVE
PIGEON SERUM ABS: NEGATIVE
THERMOACT. SACCHARII: NEGATIVE
Thermoactinomyces vulgaris, IgG: NEGATIVE

## 2018-01-15 LAB — HIV ANTIBODY (ROUTINE TESTING W REFLEX): HIV Screen 4th Generation wRfx: NONREACTIVE

## 2018-01-16 LAB — CULTURE, BAL-QUANTITATIVE W GRAM STAIN
Culture: 10000 — AB
Culture: <10000 — AB
Gram Stain: NONE SEEN

## 2018-01-16 LAB — CULTURE, BLOOD (ROUTINE X 2)
CULTURE: NO GROWTH
Culture: NO GROWTH
Special Requests: ADEQUATE

## 2018-01-16 LAB — CULTURE, BAL-QUANTITATIVE

## 2018-01-18 LAB — QUANTIFERON-TB GOLD PLUS (RQFGPL)
QUANTIFERON MITOGEN VALUE: 0.1 [IU]/mL
QUANTIFERON NIL VALUE: 0.02 [IU]/mL
QUANTIFERON TB1 AG VALUE: 0.02 [IU]/mL
QuantiFERON TB2 Ag Value: 0.02 IU/mL

## 2018-01-18 LAB — QUANTIFERON-TB GOLD PLUS: QuantiFERON-TB Gold Plus: UNDETERMINED

## 2018-01-22 ENCOUNTER — Ambulatory Visit (INDEPENDENT_AMBULATORY_CARE_PROVIDER_SITE_OTHER): Payer: Medicare Other | Admitting: Internal Medicine

## 2018-01-22 ENCOUNTER — Encounter: Payer: Self-pay | Admitting: Internal Medicine

## 2018-01-22 VITALS — BP 138/80 | HR 83 | Ht 73.0 in | Wt 232.0 lb

## 2018-01-22 DIAGNOSIS — J189 Pneumonia, unspecified organism: Secondary | ICD-10-CM | POA: Diagnosis not present

## 2018-01-22 LAB — VIRUS CULTURE

## 2018-01-22 MED ORDER — BENZONATATE 100 MG PO CAPS: 200.0000 mg | ORAL_CAPSULE | Freq: Three times a day (TID) | ORAL | 3 refills | Status: AC

## 2018-01-22 NOTE — Progress Notes (Signed)
* [facility]     Assessment and Plan:  Bilateral multifocal pneumonia with groundglass changes of uncertain etiology, there is a broad differential for these findings including organizing pneumonia, secondary to rheumatological disease, as well as hypersensitivity pneumonitis, versus infectious etiology. 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  -Prescribed cough medicine today-Tessalon Perles --Continue steroids. --s/p bronchoscopy, all testing negative.  - Nephrology planning kidney biopsy. -Patient is following up with Dr. Raul Del.  Meds ordered this encounter  Medications  . benzonatate (TESSALON PERLES) 100 MG capsule    Sig: Take 2 capsules (200 mg total) by mouth 3 (three) times daily.    Dispense:  90 capsule    Refill:  3      Date: 01/22/2018  MRN# [id number] Logan Murphy 1946/03/04   Logan Murphy is a 72 y.o. old male seen in follow up for chief complaint of  Chief Complaint  Patient presents with  . Hospitalization Follow-up    NP cough: SOB at times: wheezing; chest tightness     HPI:   Patient was seen in the hospital, noted to have bilateral multifocal pneumonia with groundglass changes of uncertain etiology.  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 All cultures and biopsies were negative. Working diagnosis is possibly a vasculitis.  He was seen by nephrology and is planning on going for a kidney biopsy. He takes codeine cough syrup which helps him sleep.  He takes mucinex for cough which helps little he continues to have issues with cough.    Medication:    Current Outpatient Medications:  .  albuterol (PROVENTIL HFA;VENTOLIN HFA) 108 (90 Base) MCG/ACT inhaler, Inhale 1-2 puffs into the lungs every 6 (six) hours as needed for wheezing  or shortness of breath. , Disp: , Rfl:  .  allopurinol (ZYLOPRIM) 300 MG tablet, Take 300 mg by mouth daily., Disp: , Rfl:  .  aspirin EC 81 MG tablet, Take 81 mg by mouth daily., Disp: , Rfl:  .  azithromycin (ZITHROMAX) 250 MG tablet, Take 1 tablet daily for 6 days for bronchitis, Disp: 6 each, Rfl: 0 .  Cholecalciferol 400 units CAPS, Take 400 Units by mouth daily. , Disp: , Rfl:  .  hydrochlorothiazide (MICROZIDE) 12.5 MG capsule, Take 12.5 mg by mouth daily., Disp: , Rfl:  .  levothyroxine (SYNTHROID, LEVOTHROID) 75 MCG tablet, Take 75 mcg by mouth daily., Disp: , Rfl:  .  lisinopril (PRINIVIL,ZESTRIL) 40 MG tablet, Take 40 mg by mouth daily., Disp: , Rfl:  .  pantoprazole (PROTONIX) 40 MG tablet, Take 1 tablet (40 mg total) by mouth daily., Disp: 30 tablet, Rfl: 0 .  predniSONE (DELTASONE) 10 MG tablet, Take 6 tablets (60 mg total) by mouth daily with breakfast., Disp: 30 tablet, Rfl: 0 .  protein supplement shake (PREMIER PROTEIN) LIQD, Take 325 mLs (11 oz total) by mouth daily for 27 days., Disp: 8775 mL, Rfl: 0 .  tamsulosin (FLOMAX) 0.4 MG CAPS capsule, Take 0.4 mg by mouth daily., Disp: , Rfl:    Allergies:  Patient has no known allergies.  Review of Systems: Patient denies PND, orthopnea, palpitations.  Remainder of the review of systems was reviewed and was negative.  Physical Examination:   VS: BP 138/80 (BP Location: Left Arm, Cuff Size: Normal)   Pulse 83   Ht 6\' 1"  (1.854 m)   Wt 232 lb (105.2 kg)  SpO2 96%   BMI 30.61 kg/m    General Appearance: No distress  Neuro:without focal findings,  speech normal,  HEENT: PERRLA, EOM intact. Pulmonary: normal breath sounds, No wheezing.   CardiovascularNormal S1,S2.  No m/r/g.   Abdomen: Benign, Soft, non-tender. Renal:  No costovertebral tenderness  GU:  Not performed at this time. Endoc: No evident thyromegaly, no signs of acromegaly. Skin:   warm, no rash. Extremities: normal, no cyanosis, clubbing.   LABORATORY  PANEL:   CBC No results for input(s): WBC, HGB, HCT, PLT in the last 168 hours. ------------------------------------------------------------------------------------------------------------------  Chemistries  No results for input(s): NA, K, CL, CO2, GLUCOSE, BUN, CREATININE, CALCIUM, MG, AST, ALT, ALKPHOS, BILITOT in the last 168 hours.  Invalid input(s): GFRCGP ------------------------------------------------------------------------------------------------------------------  Cardiac Enzymes No results for input(s): TROPONINI in the last 168 hours. ------------------------------------------------------------  RADIOLOGY:   No results found for this or any previous visit. Results for orders placed during the hospital encounter of 01/11/18  DG Chest 2 View   Narrative CLINICAL DATA:  Fever cough and shortness of breath.  EXAM: CHEST  2 VIEW  COMPARISON:  11/23/2017 chest CT.  FINDINGS: RIGHT LOWER lobe airspace disease is compatible with pneumonia.  Cardiomediastinal silhouette is unremarkable.  No pleural effusion or pneumothorax.  No acute bony abnormalities identified.  IMPRESSION: RIGHT LOWER lobe airspace disease compatible with pneumonia. Radiographic follow-up to resolution is recommended.   Electronically Signed   By: Margarette Canada M.D.   On: 01/10/2018 23:30    ------------------------------------------------------------------------------------------------------------------  Thank  you for allowing [facility] to assist in the care of your patient. Our recommendations are noted above.  Please contact us if we can be of further service.   Marda Stalker, MD.  [facility] Office Number: [phone]  Patricia Pesa, M.D.  Merton Border, M.D  01/22/2018

## 2018-01-22 NOTE — Patient Instructions (Signed)
Continue with cough syrup as needed.  Start tessalon as needed.

## 2018-01-27 ENCOUNTER — Other Ambulatory Visit: Payer: Self-pay | Admitting: Nephrology

## 2018-01-27 DIAGNOSIS — R319 Hematuria, unspecified: Secondary | ICD-10-CM

## 2018-01-28 DIAGNOSIS — I776 Arteritis, unspecified: Secondary | ICD-10-CM | POA: Insufficient documentation

## 2018-02-03 LAB — CULTURE, FUNGUS WITHOUT SMEAR

## 2018-02-05 ENCOUNTER — Encounter
Admission: RE | Admit: 2018-02-05 | Discharge: 2018-02-05 | Disposition: A | Discharge status: Home / Self Care | Payer: Medicare Other | Source: Ambulatory Visit | Attending: Nephrology | Admitting: Nephrology

## 2018-02-05 DIAGNOSIS — Z01812 Encounter for preprocedural laboratory examination: Secondary | ICD-10-CM | POA: Insufficient documentation

## 2018-02-05 LAB — URINALYSIS, ROUTINE W REFLEX MICROSCOPIC
BILIRUBIN URINE: NEGATIVE
Bacteria, UA: NONE SEEN
GLUCOSE, UA: NEGATIVE mg/dL
KETONES UR: NEGATIVE mg/dL
LEUKOCYTES UA: NEGATIVE
NITRITE: NEGATIVE
PH: 6 (ref 5.0–8.0)
Protein, ur: NEGATIVE mg/dL
Specific Gravity, Urine: 1.016 (ref 1.005–1.030)
Squamous Epithelial / LPF: NONE SEEN

## 2018-02-05 LAB — DIFFERENTIAL
BASOS ABS: 0 10*3/uL (ref 0–0.1)
Basophils Relative: 0 %
Eosinophils Absolute: 0 10*3/uL (ref 0–0.7)
Eosinophils Relative: 0 %
LYMPHS PCT: 13 %
Lymphs Abs: 1.7 10*3/uL (ref 1.0–3.6)
MONO ABS: 1.2 10*3/uL — AB (ref 0.2–1.0)
Monocytes Relative: 9 %
Neutro Abs: 10.2 10*3/uL — ABNORMAL HIGH (ref 1.4–6.5)
Neutrophils Relative %: 78 %

## 2018-02-05 LAB — COMPREHENSIVE METABOLIC PANEL
ALK PHOS: 57 U/L (ref 38–126)
ALT: 38 U/L (ref 17–63)
ANION GAP: 12 (ref 5–15)
AST: 27 U/L (ref 15–41)
Albumin: 3.4 g/dL — ABNORMAL LOW (ref 3.5–5.0)
BUN: 30 mg/dL — ABNORMAL HIGH (ref 6–20)
CALCIUM: 9.2 mg/dL (ref 8.9–10.3)
CO2: 26 mmol/L (ref 22–32)
Chloride: 93 mmol/L — ABNORMAL LOW (ref 101–111)
Creatinine, Ser: 1.17 mg/dL (ref 0.61–1.24)
GFR calc non Af Amer: >60 mL/min (ref >60)
Glucose, Bld: 67 mg/dL (ref 65–99)
POTASSIUM: 4.4 mmol/L (ref 3.5–5.1)
Sodium: 131 mmol/L — ABNORMAL LOW (ref 135–145)
TOTAL PROTEIN: 6.8 g/dL (ref 6.5–8.1)
Total Bilirubin: 1.3 mg/dL — ABNORMAL HIGH (ref 0.3–1.2)

## 2018-02-05 LAB — CBC
HEMATOCRIT: 42.8 % (ref 40.0–52.0)
Hemoglobin: 14 g/dL (ref 13.0–18.0)
MCH: 29.3 pg (ref 26.0–34.0)
MCHC: 32.6 g/dL (ref 32.0–36.0)
MCV: 89.7 fL (ref 80.0–100.0)
PLATELETS: 238 10*3/uL (ref 150–440)
RBC: 4.77 MIL/uL (ref 4.40–5.90)
RDW: 15.9 % — AB (ref 11.5–14.5)
WBC: 13.1 10*3/uL — AB (ref 3.8–10.6)

## 2018-02-05 LAB — PROTEIN / CREATININE RATIO, URINE
CREATININE, URINE: 91 mg/dL
PROTEIN CREATININE RATIO: 0.24 mg/mg{creat} — AB (ref 0.00–0.15)
TOTAL PROTEIN, URINE: 22 mg/dL

## 2018-02-05 LAB — TYPE AND SCREEN
ABO/RH(D): B NEG
Antibody Screen: NEGATIVE

## 2018-02-05 LAB — PROTIME-INR
INR: 0.94
Prothrombin Time: 12.5 seconds (ref 11.4–15.2)

## 2018-02-08 ENCOUNTER — Ambulatory Visit: Payer: Medicare Other

## 2018-02-08 ENCOUNTER — Other Ambulatory Visit: Payer: Self-pay | Admitting: Radiology

## 2018-02-09 ENCOUNTER — Ambulatory Visit
Admission: RE | Admit: 2018-02-09 | Discharge: 2018-02-09 | Disposition: A | Discharge status: Home / Self Care | Payer: Medicare Other | Source: Ambulatory Visit | Attending: Nephrology | Admitting: Nephrology

## 2018-02-09 DIAGNOSIS — N058 Unspecified nephritic syndrome with other morphologic changes: Secondary | ICD-10-CM | POA: Diagnosis not present

## 2018-02-09 DIAGNOSIS — R319 Hematuria, unspecified: Secondary | ICD-10-CM

## 2018-02-09 LAB — CBC
HEMATOCRIT: 42.5 % (ref 40.0–52.0)
HEMOGLOBIN: 14 g/dL (ref 13.0–18.0)
MCH: 29.4 pg (ref 26.0–34.0)
MCHC: 33.1 g/dL (ref 32.0–36.0)
MCV: 88.9 fL (ref 80.0–100.0)
Platelets: 230 10*3/uL (ref 150–440)
RBC: 4.78 MIL/uL (ref 4.40–5.90)
RDW: 16.3 % — ABNORMAL HIGH (ref 11.5–14.5)
WBC: 13.2 10*3/uL — ABNORMAL HIGH (ref 3.8–10.6)

## 2018-02-09 LAB — PROTIME-INR
INR: 0.88
PROTHROMBIN TIME: 11.9 s (ref 11.4–15.2)

## 2018-02-09 MED ORDER — SODIUM CHLORIDE 0.9 % IV SOLN
INTRAVENOUS | Status: DC
  Administered 2018-02-09: 10:00:00 via INTRAVENOUS

## 2018-02-09 MED ORDER — MIDAZOLAM HCL 5 MG/5ML IJ SOLN
INTRAMUSCULAR | Status: AC
  Filled 2018-02-09: qty 10

## 2018-02-09 MED ORDER — MIDAZOLAM HCL 5 MG/5ML IJ SOLN
INTRAMUSCULAR | Status: AC | PRN
  Administered 2018-02-09: 2 mg via INTRAVENOUS

## 2018-02-09 MED ORDER — FENTANYL CITRATE (PF) 100 MCG/2ML IJ SOLN
INTRAMUSCULAR | Status: AC
  Filled 2018-02-09: qty 4

## 2018-02-09 MED ORDER — FENTANYL CITRATE (PF) 100 MCG/2ML IJ SOLN
INTRAMUSCULAR | Status: AC | PRN
  Administered 2018-02-09: 50 ug via INTRAVENOUS

## 2018-02-09 NOTE — H&P (Signed)
Chief Complaint: Hematuria   Referring Physician(s): Kolluru,Sarath  Supervising Physician: Marybelle Killings  Patient Status: [facility] - Out-pt  History of Present Illness: Logan Murphy is a 72 y.o. male with hypothyroidism, hypertension, gout, who was admitted to [facility] on 01/11/2018 for healthcare-associated pneumonia.  Since october he has been having weakness, aches and arthralgias.   He was treated with antibiotics initially with little improvement in symptoms. He was then treated with prednisone with improvement in symptoms.  He reports he started to have hematuria in December and also had a mild rash.  UA don 3/22 shows moderate Hgb. His creatinine has been normal. Baseline is 0.7-1.0.  He saw Dr. Juleen China on 01/13/2018 and he is requesting a random renal biopsy.   Past Medical History:  Diagnosis Date  . Gout   . Hypertension   . Thyroid disease     Past Surgical History:  Procedure Laterality Date  . CHOLECYSTECTOMY    . TOTAL HIP ARTHROPLASTY Right   . VIDEO BRONCHOSCOPY Bilateral 01/13/2018   Procedure: VIDEO BRONCHOSCOPY WITH FLUORO;  Surgeon: [name], MD;  Location: [facility] ORS;  Service: Cardiopulmonary;  Laterality: Bilateral;    Allergies: Bee venom  Medications: Prior to Admission medications   Medication Sig Start Date End Date Taking? Authorizing Provider  albuterol (PROVENTIL HFA;VENTOLIN HFA) 108 (90 Base) MCG/ACT inhaler Inhale 1-2 puffs into the lungs every 6 (six) hours as needed for wheezing or shortness of breath.    Yes [provider]  allopurinol (ZYLOPRIM) 300 MG tablet Take 300 mg by mouth daily.   Yes [provider]  benzonatate (TESSALON PERLES) 100 MG capsule Take 2 capsules (200 mg total) by mouth 3 (three) times daily. 01/22/18 01/22/19 Yes [name], MD  Cholecalciferol 400 units CAPS Take 400 Units by mouth daily.    Yes [provider]  hydrochlorothiazide (MICROZIDE) 12.5 MG capsule  Take 12.5 mg by mouth daily.   Yes [provider]  levothyroxine (SYNTHROID, LEVOTHROID) 75 MCG tablet Take 75 mcg by mouth daily.   Yes [provider]  lisinopril (PRINIVIL,ZESTRIL) 40 MG tablet Take 40 mg by mouth daily.   Yes [provider]  pantoprazole (PROTONIX) 40 MG tablet Take 1 tablet (40 mg total) by mouth daily. 01/14/18  Yes Bettey Costa, MD  predniSONE (DELTASONE) 10 MG tablet Take 6 tablets (60 mg total) by mouth daily with breakfast. 01/14/18  Yes Mody, Sital, MD  tamsulosin (FLOMAX) 0.4 MG CAPS capsule Take 0.4 mg by mouth daily.   Yes [provider]  aspirin EC 81 MG tablet Take 81 mg by mouth daily.    [provider]  protein supplement shake (PREMIER PROTEIN) LIQD Take 325 mLs (11 oz total) by mouth daily for 27 days. Patient not taking: Reported on 02/09/2018 01/15/18 02/11/18  Bettey Costa, MD     Family History  Problem Relation Age of Onset  . Hypertension Other     Social History   Socioeconomic History  . Marital status: Married    Spouse name: Not on file  . Number of children: Not on file  . Years of education: Not on file  . Highest education level: Not on file  Occupational History  . Not on file  Social Needs  . Financial resource strain: Not on file  . Food insecurity:    Worry: Not on file    Inability: Not on file  . Transportation needs:    Medical: Not on file    Non-medical:  Not on file  Tobacco Use  . Smoking status: Never Smoker  . Smokeless tobacco: Never Used  Substance and Sexual Activity  . Alcohol use: Yes  . Drug use: No  . Sexual activity: Not on file  Lifestyle  . Physical activity:    Days per week: Not on file    Minutes per session: Not on file  . Stress: Not on file  Relationships  . Social connections:    Talks on phone: Not on file    Gets together: Not on file    Attends religious service: Not on file    Active member of club or organization: Not on file    Attends  meetings of clubs or organizations: Not on file    Relationship status: Not on file  Other Topics Concern  . Not on file  Social History Narrative  . Not on file   Review of Systems: A 12 point ROS discussed and pertinent positives are indicated in the HPI above.  All other systems are negative. Review of Systems  Vital Signs: BP (!) 145/78   Pulse 67   Temp (!) 97.4 F (36.3 C) (Oral)   Resp 15   Ht 6\' 1"  (1.854 m)   Wt 232 lb (105.2 kg)   BMI 30.61 kg/m   Physical Exam  Constitutional: He is oriented to person, place, and time. He appears well-developed.  HENT:  Head: Normocephalic and atraumatic.  Eyes: EOM are normal.  Neck: Normal range of motion.  Cardiovascular: Normal rate, regular rhythm and normal heart sounds.  Pulmonary/Chest: Effort normal and breath sounds normal.  Abdominal: Soft. He exhibits no distension. There is no tenderness.  Musculoskeletal: Normal range of motion.  Neurological: He is alert and oriented to person, place, and time.  Skin: Skin is warm and dry.  Psychiatric: He has a normal mood and affect. His behavior is normal. Judgment and thought content normal.  Vitals reviewed.  Imaging: Dg Chest 1 View  Result Date: 01/13/2018 CLINICAL DATA:  Status post bronchoscopy and lung biopsy. EXAM: CHEST 1 VIEW COMPARISON:  Chest x-ray dated January 10, 2018. FINDINGS: The heart size and mediastinal contours are within normal limits. Normal pulmonary vascularity. Unchanged airspace disease within both mid to lower lungs. No pleural effusion or pneumothorax. No acute osseous abnormality. IMPRESSION: 1. No pneumothorax status post bronchoscopy and lung biopsy. 2. Unchanged bilateral mid to lower lung airspace disease. Electronically Signed   By: Titus Dubin M.D.   On: 01/13/2018 16:27   Dg Chest 2 View  Result Date: 01/10/2018 CLINICAL DATA:  Fever cough and shortness of breath. EXAM: CHEST  2 VIEW COMPARISON:  11/23/2017 chest CT. FINDINGS: RIGHT  LOWER lobe airspace disease is compatible with pneumonia. Cardiomediastinal silhouette is unremarkable. No pleural effusion or pneumothorax. No acute bony abnormalities identified. IMPRESSION: RIGHT LOWER lobe airspace disease compatible with pneumonia. Radiographic follow-up to resolution is recommended. Electronically Signed   By: Margarette Canada M.D.   On: 01/10/2018 23:30   Ct Chest Wo Contrast  Result Date: 01/11/2018 CLINICAL DATA:  72 year old male with acute respiratory illness. EXAM: CT CHEST WITHOUT CONTRAST TECHNIQUE: Multidetector CT imaging of the chest was performed following the standard protocol without IV contrast. COMPARISON:  Chest radiograph dated 01/10/2018 FINDINGS: Cardiovascular: There is no cardiomegaly or pericardial effusion. Mild atherosclerotic calcification of the thoracic aorta. The central pulmonary arteries are grossly unremarkable on this noncontrast CT. Mediastinum/Nodes: No hilar or mediastinal adenopathy. Esophagus is grossly unremarkable. No mediastinal fluid collection. Lungs/Pleura:  Bilateral patchy ground-glass and nodular densities involving the lower lobes, right middle lobe, lingula and to a lesser degree the upper lobes most consistent with multifocal pneumonia. Clinical correlation and follow-up to resolution recommended. There is no pleural effusion or pneumothorax. The central airways are patent. Upper Abdomen: Cholecystectomy.  Mild fatty liver. Musculoskeletal: Degenerative changes of the spine. No acute osseous pathology. IMPRESSION: Multifocal pneumonia. Electronically Signed   By: Anner Crete M.D.   On: 01/11/2018 03:10   Dg C-arm 1-60 Min-no Report  Result Date: 01/13/2018 Fluoroscopy was utilized by the requesting physician.  No radiographic interpretation.    Labs:  CBC: Recent Labs    01/11/18 0148 01/12/18 0507 02/05/18 0827 02/09/18 1007  WBC 13.3* 7.0 13.1* 13.2*  HGB 12.5* 11.4* 14.0 14.0  HCT 37.6* 33.8* 42.8 42.5  PLT 257 223 238  230    COAGS: Recent Labs    02/05/18 0827 02/09/18 1007  INR 0.94 0.88    BMP: Recent Labs    01/12/18 0507 01/13/18 0829 01/14/18 0340 02/05/18 0827  NA 129* 133* 138 131*  K 3.9 3.8 3.4* 4.4  CL 97* 100* 104 93*  CO2 25 24 25 26   GLUCOSE 120* 119* 101* 67  BUN 20 19 21* 30*  CALCIUM 8.4* 8.9 8.5* 9.2  CREATININE 0.84 0.75 0.80 1.17  GFRNONAA >60 >60 >60 >60  GFRAA >60 >60 >60 >60    LIVER FUNCTION TESTS: Recent Labs    02/14/17 0000 01/11/18 0148 02/05/18 0827  BILITOT 1.0 1.0 1.3*  AST 24 29 27   ALT 24 26 38  ALKPHOS 51 47 57  PROT 7.1 7.5 6.8  ALBUMIN 3.7 3.6 3.4*    TUMOR MARKERS: No results for input(s): AFPTM, CEA, CA199, CHROMGRNA in the last 8760 hours.  Assessment and Plan:  Hematuria  Will proceed with image guided random renal biopsy by Dr. Barbie Banner.  Risks and benefits discussed with the patient including, but not limited to bleeding, infection, damage to adjacent structures or low yield requiring additional tests.  All of the patient's questions were answered, patient is agreeable to proceed. Consent signed and in chart.  Thank you for this interesting consult.  I greatly enjoyed meeting Logan Murphy and look forward to participating in their care.  A copy of this report was sent to the requesting provider on this date.  Electronically Signed: Murrell Redden, PA-C 02/09/2018, 10:28 AM   I spent a total of  30 Minutes in face to face in clinical consultation, greater than 50% of which was counseling/coordinating care for random renal biopsy.

## 2018-02-09 NOTE — Procedures (Signed)
Random renal biopsy 16 g times two EBL 0 Comp 0

## 2018-02-09 NOTE — Discharge Instructions (Signed)

## 2018-02-16 LAB — SURGICAL PATHOLOGY

## 2018-02-17 ENCOUNTER — Encounter: Payer: Self-pay | Admitting: Nephrology

## 2018-02-18 DIAGNOSIS — Z79899 Other long term (current) drug therapy: Secondary | ICD-10-CM | POA: Insufficient documentation

## 2018-02-22 ENCOUNTER — Encounter: Payer: Self-pay | Admitting: Oncology

## 2018-02-22 ENCOUNTER — Inpatient Hospital Stay: Payer: Medicare Other

## 2018-02-22 ENCOUNTER — Inpatient Hospital Stay: Payer: Medicare Other | Attending: Oncology | Admitting: Oncology

## 2018-02-22 VITALS — BP 130/71 | HR 97 | Temp 97.0°F | Resp 18 | Ht 73.0 in | Wt 236.8 lb

## 2018-02-22 VITALS — BP 146/71 | HR 65 | Temp 96.3°F | Resp 18

## 2018-02-22 DIAGNOSIS — E079 Disorder of thyroid, unspecified: Secondary | ICD-10-CM | POA: Diagnosis not present

## 2018-02-22 DIAGNOSIS — G8929 Other chronic pain: Secondary | ICD-10-CM | POA: Insufficient documentation

## 2018-02-22 DIAGNOSIS — Z7952 Long term (current) use of systemic steroids: Secondary | ICD-10-CM | POA: Diagnosis not present

## 2018-02-22 DIAGNOSIS — M3131 Wegener's granulomatosis with renal involvement: Secondary | ICD-10-CM | POA: Insufficient documentation

## 2018-02-22 DIAGNOSIS — I2510 Atherosclerotic heart disease of native coronary artery without angina pectoris: Secondary | ICD-10-CM | POA: Diagnosis not present

## 2018-02-22 DIAGNOSIS — Z5112 Encounter for antineoplastic immunotherapy: Secondary | ICD-10-CM | POA: Insufficient documentation

## 2018-02-22 DIAGNOSIS — R040 Epistaxis: Secondary | ICD-10-CM | POA: Diagnosis not present

## 2018-02-22 DIAGNOSIS — Z79899 Other long term (current) drug therapy: Secondary | ICD-10-CM

## 2018-02-22 DIAGNOSIS — I10 Essential (primary) hypertension: Secondary | ICD-10-CM | POA: Insufficient documentation

## 2018-02-22 DIAGNOSIS — R5383 Other fatigue: Secondary | ICD-10-CM | POA: Diagnosis not present

## 2018-02-22 DIAGNOSIS — Z79620 Long term (current) use of immunosuppressive biologic: Secondary | ICD-10-CM

## 2018-02-22 DIAGNOSIS — R5381 Other malaise: Secondary | ICD-10-CM | POA: Diagnosis not present

## 2018-02-22 DIAGNOSIS — Z85828 Personal history of other malignant neoplasm of skin: Secondary | ICD-10-CM | POA: Diagnosis not present

## 2018-02-22 DIAGNOSIS — G908 Other disorders of autonomic nervous system: Secondary | ICD-10-CM

## 2018-02-22 DIAGNOSIS — M109 Gout, unspecified: Secondary | ICD-10-CM | POA: Insufficient documentation

## 2018-02-22 DIAGNOSIS — Z7982 Long term (current) use of aspirin: Secondary | ICD-10-CM | POA: Insufficient documentation

## 2018-02-22 DIAGNOSIS — Z5181 Encounter for therapeutic drug level monitoring: Secondary | ICD-10-CM

## 2018-02-22 MED ORDER — RITUXIMAB CHEMO INJECTION 500 MG/50ML
1000.0000 mg | Freq: Once | INTRAVENOUS | Status: AC
  Administered 2018-02-22: 1000 mg via INTRAVENOUS
  Filled 2018-02-22 (×2): qty 100

## 2018-02-22 MED ORDER — DIPHENHYDRAMINE HCL 25 MG PO CAPS
50.0000 mg | ORAL_CAPSULE | Freq: Once | ORAL | Status: AC
  Administered 2018-02-22: 50 mg via ORAL

## 2018-02-22 MED ORDER — ACETAMINOPHEN 325 MG PO TABS
650.0000 mg | ORAL_TABLET | Freq: Once | ORAL | Status: AC
  Administered 2018-02-22: 650 mg via ORAL

## 2018-02-22 MED ORDER — METHYLPREDNISOLONE SODIUM SUCC 125 MG IJ SOLR
100.0000 mg | Freq: Once | INTRAMUSCULAR | Status: AC
  Administered 2018-02-22: 100 mg via INTRAVENOUS

## 2018-02-22 MED ORDER — SODIUM CHLORIDE 0.9 % IV SOLN
Freq: Once | INTRAVENOUS | Status: AC
  Administered 2018-02-22: 11:00:00 via INTRAVENOUS
  Filled 2018-02-22: qty 1000

## 2018-02-22 NOTE — Progress Notes (Addendum)
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  He had symptoms of renal syndrome which started in October 2018.  He initially had chronic dry cough, persistent dyspnea as well as petechial vasculitic rash in his lower extremities. CT scan showed groundglass opacities in his lungs and bronchoscopy was nondiagnostic.  Serology showed positive PR-3 ANCA.  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  Moderate arteriosclerosis with mild interstitial fibrosis and tubular atrophy.  Patient was started on high-dose steroids 60 mg and is currently on a steroid taper at 40 mg.  He is yet to start taking his PCP prophylaxis.  After starting steroids his symptoms of cough and shortness of breath as well as rash has resolved.  He continues to have significant fatigue which is his biggest complaint today.  He was evaluated by Dr. Lyndel Pleasure- bock from rheumatology and has been recommended induction treatment with Rituxan 1 dose x2 at 14-day interval.  He is here to receive the same.  Prior to onset of his symptoms in October 2018 he was quite and working out regularly.  His exercise capacity has gone down since his diagnosis.  ECOG PS- 1  Pain scale- 0   Review of systems- Review of Systems  Constitutional: Positive for malaise/fatigue. Negative for chills, fever and weight loss.  HENT:  Negative for congestion, ear discharge and nosebleeds.   Eyes: Negative for blurred vision.  Respiratory: Negative for cough, hemoptysis, sputum production, shortness of breath and wheezing.   Cardiovascular: Negative for chest pain, palpitations, orthopnea and claudication.  Gastrointestinal: Negative for abdominal pain, blood in stool, constipation, diarrhea, heartburn, melena, nausea and vomiting.  Genitourinary: Negative for dysuria, flank pain, frequency, hematuria and urgency.  Musculoskeletal: Negative for back pain, joint pain and myalgias.  Skin: Negative for rash.  Neurological: Negative for dizziness, tingling, focal weakness, seizures, weakness and headaches.  Endo/Heme/Allergies: Does not bruise/bleed easily.  Psychiatric/Behavioral: Negative for depression and suicidal ideas. The patient does not have insomnia.     Allergies  Allergen Reactions  . Bee Venom Anaphylaxis    Red fire ants    Patient Active Problem List   Diagnosis Date Noted  . 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 HCAP (healthcare-associated pneumonia) 01/11/2018  . Ileus ([place]) 02/03/2017  . Enteritis      Past Medical History:  Diagnosis Date  . 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 Chronic pain   . Gout   . Gout   . Hypertension   . Skin cancer   . Thyroid disease      Past Surgical History:  Procedure Laterality Date  . CHOLECYSTECTOMY    . GALLBLADDER SURGERY    . TOTAL HIP ARTHROPLASTY Right   . VIDEO BRONCHOSCOPY Bilateral 01/13/2018   Procedure: VIDEO BRONCHOSCOPY WITH FLUORO;  Surgeon: [name], MD;  Location: [facility] ORS;  Service: Cardiopulmonary;  Laterality: Bilateral;    Social History   Socioeconomic History  . Marital status: Married  Spouse name: Not on file  . Number of children: Not on file  . Years of education: Not on file  . Highest education level: Not on file  Occupational History  . Not on file    Social Needs  . Financial resource strain: Not on file  . Food insecurity:    Worry: Not on file    Inability: Not on file  . Transportation needs:    Medical: Not on file    Non-medical: Not on file  Tobacco Use  . Smoking status: Never Smoker  . Smokeless tobacco: Never Used  Substance and Sexual Activity  . Alcohol use: Yes  . Drug use: No  . Sexual activity: Not on file  Lifestyle  . Physical activity:    Days per week: Not on file    Minutes per session: Not on file  . Stress: Not on file  Relationships  . Social connections:    Talks on phone: Not on file    Gets together: Not on file    Attends religious service: Not on file    Active member of club or organization: Not on file    Attends meetings of clubs or organizations: Not on file    Relationship status: Not on file  . Intimate partner violence:    Fear of current or ex partner: Not on file    Emotionally abused: Not on file    Physically abused: Not on file    Forced sexual activity: Not on file  Other Topics Concern  . Not on file  Social History Narrative  . Not on file     Family History  Problem Relation Age of Onset  . Hypertension Other      Current Outpatient Medications:  .  allopurinol (ZYLOPRIM) 300 MG tablet, Take 300 mg by mouth daily., Disp: , Rfl:  .  aspirin EC 81 MG tablet, Take 81 mg by mouth daily., Disp: , Rfl:  .  benzonatate (TESSALON PERLES) 100 MG capsule, Take 2 capsules (200 mg total) by mouth 3 (three) times daily., Disp: 90 capsule, Rfl: 3 .  calcium acetate (PHOSLO) 667 MG capsule, Take 600 mg by mouth 2 (two) times daily., Disp: , Rfl:  .  hydrochlorothiazide (MICROZIDE) 12.5 MG capsule, Take 12.5 mg by mouth daily., Disp: , Rfl:  .  levothyroxine (SYNTHROID, LEVOTHROID) 75 MCG tablet, Take 75 mcg by mouth daily., Disp: , Rfl:  .  lisinopril (PRINIVIL,ZESTRIL) 40 MG tablet, Take 40 mg by mouth daily., Disp: , Rfl:  .  pantoprazole (PROTONIX) 40 MG tablet, Take 1 tablet  (40 mg total) by mouth daily., Disp: 30 tablet, Rfl: 0 .  predniSONE (DELTASONE) 10 MG tablet, Take 6 tablets (60 mg total) by mouth daily with breakfast., Disp: 30 tablet, Rfl: 0 .  tamsulosin (FLOMAX) 0.4 MG CAPS capsule, Take 0.4 mg by mouth daily., Disp: , Rfl:  .  albuterol (PROVENTIL HFA;VENTOLIN HFA) 108 (90 Base) MCG/ACT inhaler, Inhale 1-2 puffs into the lungs every 6 (six) hours as needed for wheezing or shortness of breath. , Disp: , Rfl:  .  Cholecalciferol 400 units CAPS, Take 400 Units by mouth daily. , Disp: , Rfl:    Physical exam:  Vitals:   02/22/18 0947  BP: 130/71  Pulse: 97  Resp: 18  Temp: (!) 97 F (36.1 C)  SpO2: 97%  Weight: 236 lb 12.4 oz (107.4 kg)  Height: 6\' 1"  (1.854 m)   Physical Exam  Constitutional: He is oriented  to person, place, and time.  HENT:  Head: Normocephalic and atraumatic.  Eyes: Pupils are equal, round, and reactive to light. EOM are normal.  Neck: Normal range of motion.  Cardiovascular: Normal rate, regular rhythm and normal heart sounds.  Pulmonary/Chest: Effort normal and breath sounds normal.  Abdominal: Soft. Bowel sounds are normal.  Neurological: He is alert and oriented to person, place, and time.  Skin: Skin is warm and dry.       CMP Latest Ref Rng & Units 02/05/2018  Glucose 65 - 99 mg/dL 67  BUN 6 - 20 mg/dL 30(H)  Creatinine 0.61 - 1.24 mg/dL 1.17  Sodium 135 - 145 mmol/L 131(L)  Potassium 3.5 - 5.1 mmol/L 4.4  Chloride 101 - 111 mmol/L 93(L)  CO2 22 - 32 mmol/L 26  Calcium 8.9 - 10.3 mg/dL 9.2  Total Protein 6.5 - 8.1 g/dL 6.8  Total Bilirubin 0.3 - 1.2 mg/dL 1.3(H)  Alkaline Phos 38 - 126 U/L 57  AST 15 - 41 U/L 27  ALT 17 - 63 U/L 38   CBC Latest Ref Rng & Units 02/09/2018  WBC 3.8 - 10.6 K/uL 13.2(H)  Hemoglobin 13.0 - 18.0 g/dL 14.0  Hematocrit 40.0 - 52.0 % 42.5  Platelets 150 - 440 K/uL 230    No images are attached to the encounter.  US Biopsy (kidney)  Result Date: 02/09/2018 INDICATION:  Hematuria EXAM: ULTRASOUND-GUIDED RANDOM RENAL CORTEX CORE BIOPSY MEDICATIONS: None. ANESTHESIA/SEDATION: Fentanyl 50 mcg IV; Versed 2 mg IV Moderate Sedation Time:  13 minutes The patient was continuously monitored during the procedure by the interventional radiology nurse under my direct supervision. FLUOROSCOPY TIME:  Fluoroscopy Time:  minutes  seconds ( mGy). COMPLICATIONS: None immediate. PROCEDURE: Informed written consent was obtained from the patient after a thorough discussion of the procedural risks, benefits and alternatives. All questions were addressed. Maximal Sterile Barrier Technique was utilized including caps, mask, sterile gowns, sterile gloves, sterile drape, hand hygiene and skin antiseptic. A timeout was performed prior to the initiation of the procedure. The back was prepped with ChloraPrep in a sterile fashion, and a sterile drape was applied covering the operative field. A sterile gown and sterile gloves were used for the procedure. Under sonographic guidance, 2 16 gauge core biopsies of the left renal lower pole cortex were obtained. Final imaging was performed. Patient tolerated the procedure well without complication. Vital sign monitoring by nursing staff during the procedure will continue as patient is in the special procedures unit for post procedure observation. FINDINGS: The images document guide needle placement within the left renal lower pole cortex. Post biopsy images demonstrate no hemorrhage. IMPRESSION: Successful ultrasound-guided random renal core cortex biopsy. Electronically Signed   By: Marybelle Killings M.D.   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 Assunta Gambles office on 02/15/2018 which showed a mildly elevated white count of 13, normal H&H and platelet count.  Serum creatinine was mildly elevated at 1.5.  He has also undergone hepatitis B and  hepatitis C testing including hepatitis B surface antigen and core antibody testing which was negative.  Insurance has also approved his Rituxan infusion.    Discussed risks and benefits of Rituxan including all but not limited to possible neutropenia, risk of infections and infusion reaction associated with toxin.  Patient understands and agrees to proceed as planned.  He will be getting Tylenol  and Benadryl for premeds along with 100 mg IV Solu-Medrol.  He will get dose 1 of Rituxan at 1 g today.  I will see him back in 2 weeks with CBC, CMP for dose 2 of Rituxan.  Further doses of Rituxan for maintenance scheduling will be decided by rheumatology.  Patient knows to call or the rheumatology office if he has any side effects rituxan in the next 2 weeks   Thank you for this kind referral and the opportunity to participate in the care of this patient   Visit Diagnosis 1. 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 Encounter for monitoring rituximab therapy     Dr. Randa Evens, MD, MPH [facility] [phone] 02/22/2018  2:34 PM               '

## 2018-02-22 NOTE — Patient Instructions (Signed)

## 2018-02-26 ENCOUNTER — Encounter: Payer: Medicare Other | Admitting: Oncology

## 2018-02-26 LAB — ACID FAST CULTURE WITH REFLEXED SENSITIVITIES: ACID FAST CULTURE - AFSCU3: NEGATIVE

## 2018-03-03 ENCOUNTER — Emergency Department
Admission: EM | Admit: 2018-03-03 | Discharge: 2018-03-03 | Disposition: A | Discharge status: Home / Self Care | Payer: Medicare Other | Attending: Emergency Medicine | Admitting: Emergency Medicine

## 2018-03-03 ENCOUNTER — Encounter: Payer: Self-pay | Admitting: Emergency Medicine

## 2018-03-03 ENCOUNTER — Other Ambulatory Visit: Payer: Self-pay

## 2018-03-03 DIAGNOSIS — Z79899 Other long term (current) drug therapy: Secondary | ICD-10-CM | POA: Diagnosis not present

## 2018-03-03 DIAGNOSIS — I10 Essential (primary) hypertension: Secondary | ICD-10-CM | POA: Insufficient documentation

## 2018-03-03 DIAGNOSIS — Z85828 Personal history of other malignant neoplasm of skin: Secondary | ICD-10-CM | POA: Diagnosis not present

## 2018-03-03 DIAGNOSIS — Z7982 Long term (current) use of aspirin: Secondary | ICD-10-CM | POA: Diagnosis not present

## 2018-03-03 DIAGNOSIS — R040 Epistaxis: Secondary | ICD-10-CM | POA: Insufficient documentation

## 2018-03-03 NOTE — ED Notes (Signed)
No further bleeding noted or reported

## 2018-03-03 NOTE — ED Provider Notes (Signed)
[facility] Emergency Department Provider Note  ____________________________________________   First MD Initiated Contact with Patient 03/03/18 0522     (approximate)  I have reviewed the triage vital signs and the nursing notes.   HISTORY  Chief Complaint Epistaxis   HPI MAXXIMUS GOTAY is a 72 y.o. male who self presents to the emergency department with bilateral epistaxis that awoke him from sleep roughly half hour prior to arrival.  He has a past medical history of Wegener's granulomatosis and is currently taking prednisone which he attributes to frequent nosebleeds recently.  He takes no aspirin, Plavix, or other blood thinning medications.  He denies trauma.  He denies chest pain or shortness of breath.  Past Medical History:  Diagnosis Date  . 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 Chronic pain   . Gout   . Gout   . Hypertension   . Skin cancer   . Thyroid disease     Patient Active Problem List   Diagnosis Date Noted  . 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 HCAP (healthcare-associated pneumonia) 01/11/2018  . Ileus ([place]) 02/03/2017  . Enteritis     Past Surgical History:  Procedure Laterality Date  . CHOLECYSTECTOMY    . GALLBLADDER SURGERY    . TOTAL HIP ARTHROPLASTY Right   . VIDEO BRONCHOSCOPY Bilateral 01/13/2018   Procedure: VIDEO BRONCHOSCOPY WITH FLUORO;  Surgeon: [name], MD;  Location: [facility] ORS;  Service: Cardiopulmonary;  Laterality: Bilateral;    Prior to Admission medications   Medication Sig Start Date End Date Taking? Authorizing Provider  albuterol (PROVENTIL HFA;VENTOLIN HFA) 108 (90 Base) MCG/ACT inhaler Inhale 1-2 puffs into the lungs every 6 (six) hours as needed for wheezing or shortness of breath.     [provider]  allopurinol (ZYLOPRIM) 300 MG tablet Take 300 mg by mouth daily.    [provider]  aspirin EC 81 MG tablet Take 81  mg by mouth daily.    [provider]  benzonatate (TESSALON PERLES) 100 MG capsule Take 2 capsules (200 mg total) by mouth 3 (three) times daily. 01/22/18 01/22/19  [name], MD  calcium acetate (PHOSLO) 667 MG capsule Take 600 mg by mouth 2 (two) times daily.    [provider]  Cholecalciferol 400 units CAPS Take 400 Units by mouth daily.     [provider]  hydrochlorothiazide (MICROZIDE) 12.5 MG capsule Take 12.5 mg by mouth daily.    [provider]  levothyroxine (SYNTHROID, LEVOTHROID) 75 MCG tablet Take 75 mcg by mouth daily.    [provider]  lisinopril (PRINIVIL,ZESTRIL) 40 MG tablet Take 40 mg by mouth daily.    [provider]  pantoprazole (PROTONIX) 40 MG tablet Take 1 tablet (40 mg total) by mouth daily. 01/14/18   Bettey Costa, MD  predniSONE (DELTASONE) 10 MG tablet Take 6 tablets (60 mg total) by mouth daily with breakfast. 01/14/18   Bettey Costa, MD  tamsulosin (FLOMAX) 0.4 MG CAPS capsule Take 0.4 mg by mouth daily.    [provider]    Allergies Bee venom  Family History  Problem Relation Age of Onset  . Hypertension Other     Social History Social History   Tobacco Use  . Smoking status: Never Smoker  . Smokeless tobacco: Never Used  Substance Use Topics  . Alcohol use: Yes  . Drug use: No    Review of Systems Constitutional: No fever/chills ENT: Positive for  epistaxis Cardiovascular: Denies chest pain. Respiratory: Denies shortness of breath. Gastrointestinal: No abdominal pain.  No nausea, no vomiting.  No diarrhea.  No constipation. Musculoskeletal: Negative for back pain. Neurological: Negative for headaches   ____________________________________________   PHYSICAL EXAM:  VITAL SIGNS: ED Triage Vitals  Enc Vitals Group     BP 03/03/18 0322 (!) 185/99     Pulse Rate 03/03/18 0322 95     Resp 03/03/18 0322 18     Temp 03/03/18 0322 97.6 F (36.4 C)     Temp Source  03/03/18 0322 Oral     SpO2 03/03/18 0322 (!) 65 %     Weight 03/03/18 0321 236 lb (107 kg)     Height 03/03/18 0321 6\' 1"  (1.854 m)     Head Circumference --      Peak Flow --      Pain Score 03/03/18 0321 0     Pain Loc --      Pain Edu? --      Excl. in [place]? --     Constitutional: Alert and oriented x4 joking laughing well-appearing nontoxic no diaphoresis speaks in full clear sentences Head: Atraumatic. Nose: Dried anterior epistaxis bilaterally Mouth/Throat: No trismus no posterior involvement Neck: No stridor.   Cardiovascular: Regular rate and rhythm Respiratory: Normal respiratory effort.  No retractions. Neurologic:  Normal speech and language. No gross focal neurologic deficits are appreciated.  Skin:  Skin is warm, dry and intact. No rash noted.    ____________________________________________  LABS (all labs ordered are listed, but only abnormal results are displayed)  Labs Reviewed - No data to display   __________________________________________  EKG   ____________________________________________  RADIOLOGY   ____________________________________________   DIFFERENTIAL includes but not limited to  Anterior epistaxis, posterior epistaxis   PROCEDURES  Procedure(s) performed: o  Procedures  Critical Care performed: no  Observation: no ____________________________________________   INITIAL IMPRESSION / ASSESSMENT AND PLAN / ED COURSE  Pertinent labs & imaging results that were available during my care of the patient were reviewed by me and considered in my medical decision making (see chart for details).  At the time I saw the patient he had already had a clamp on his nose for an hour and he was no longer bleeding.  No evidence of posterior involvement.  He is not on any blood thinning medication.  Advised the patient to use Vaseline inside bilateral nares if she is having issues with dryness.  She is discharged home in improved condition  verbalizes understanding and agreement with plan.      ____________________________________________   FINAL CLINICAL IMPRESSION(S) / ED DIAGNOSES  Final diagnoses:  Acute anterior epistaxis      NEW MEDICATIONS STARTED DURING THIS VISIT:  Discharge Medication List as of 03/03/2018  5:29 AM       Note:  This document was prepared using Dragon voice recognition software and may include unintentional dictation errors.      Darel Hong, MD 03/03/18 [phone]

## 2018-03-03 NOTE — ED Triage Notes (Addendum)
Patient ambulatory to triage with steady gait, without difficulty or distress noted; pt reports left sided nose bleed since midnight with hx of same from steroid use; nose clamp applied; denies any accomp symptoms; pt currently receiving chem for vasculitis

## 2018-03-05 LAB — ACID FAST CULTURE WITH REFLEXED SENSITIVITIES (MYCOBACTERIA): Acid Fast Culture: NEGATIVE

## 2018-03-08 ENCOUNTER — Inpatient Hospital Stay (HOSPITAL_BASED_OUTPATIENT_CLINIC_OR_DEPARTMENT_OTHER): Payer: Medicare Other | Admitting: Oncology

## 2018-03-08 ENCOUNTER — Inpatient Hospital Stay: Payer: Medicare Other

## 2018-03-08 ENCOUNTER — Encounter: Payer: Self-pay | Admitting: Oncology

## 2018-03-08 VITALS — BP 124/73 | HR 81 | Temp 97.4°F | Resp 18 | Ht 73.0 in | Wt 239.0 lb

## 2018-03-08 VITALS — BP 163/84 | HR 77 | Temp 96.2°F | Resp 18

## 2018-03-08 DIAGNOSIS — M3131 Wegener's granulomatosis with renal involvement: Secondary | ICD-10-CM | POA: Diagnosis not present

## 2018-03-08 DIAGNOSIS — R040 Epistaxis: Secondary | ICD-10-CM | POA: Diagnosis not present

## 2018-03-08 DIAGNOSIS — Z7982 Long term (current) use of aspirin: Secondary | ICD-10-CM | POA: Diagnosis not present

## 2018-03-08 DIAGNOSIS — Z7952 Long term (current) use of systemic steroids: Secondary | ICD-10-CM | POA: Diagnosis not present

## 2018-03-08 DIAGNOSIS — E079 Disorder of thyroid, unspecified: Secondary | ICD-10-CM

## 2018-03-08 DIAGNOSIS — G908 Other disorders of autonomic nervous system: Secondary | ICD-10-CM | POA: Diagnosis not present

## 2018-03-08 DIAGNOSIS — R5383 Other fatigue: Secondary | ICD-10-CM

## 2018-03-08 DIAGNOSIS — Z85828 Personal history of other malignant neoplasm of skin: Secondary | ICD-10-CM

## 2018-03-08 DIAGNOSIS — Z79899 Other long term (current) drug therapy: Secondary | ICD-10-CM

## 2018-03-08 DIAGNOSIS — Z5181 Encounter for therapeutic drug level monitoring: Secondary | ICD-10-CM

## 2018-03-08 DIAGNOSIS — M109 Gout, unspecified: Secondary | ICD-10-CM | POA: Diagnosis not present

## 2018-03-08 DIAGNOSIS — R5381 Other malaise: Secondary | ICD-10-CM | POA: Diagnosis not present

## 2018-03-08 DIAGNOSIS — I2510 Atherosclerotic heart disease of native coronary artery without angina pectoris: Secondary | ICD-10-CM | POA: Diagnosis not present

## 2018-03-08 DIAGNOSIS — G8929 Other chronic pain: Secondary | ICD-10-CM | POA: Diagnosis not present

## 2018-03-08 DIAGNOSIS — I10 Essential (primary) hypertension: Secondary | ICD-10-CM

## 2018-03-08 DIAGNOSIS — Z5112 Encounter for antineoplastic immunotherapy: Secondary | ICD-10-CM | POA: Diagnosis not present

## 2018-03-08 LAB — COMPREHENSIVE METABOLIC PANEL
ALBUMIN: 3.5 g/dL (ref 3.5–5.0)
ALK PHOS: 42 U/L (ref 38–126)
ALT: 43 U/L (ref 17–63)
ANION GAP: 9 (ref 5–15)
AST: 28 U/L (ref 15–41)
BILIRUBIN TOTAL: 0.7 mg/dL (ref 0.3–1.2)
BUN: 29 mg/dL — AB (ref 6–20)
CALCIUM: 9.4 mg/dL (ref 8.9–10.3)
CO2: 30 mmol/L (ref 22–32)
CREATININE: 1.06 mg/dL (ref 0.61–1.24)
Chloride: 93 mmol/L — ABNORMAL LOW (ref 101–111)
GFR calc Af Amer: >60 mL/min (ref >60)
GFR calc non Af Amer: >60 mL/min (ref >60)
GLUCOSE: 87 mg/dL (ref 65–99)
Potassium: 4.1 mmol/L (ref 3.5–5.1)
SODIUM: 132 mmol/L — AB (ref 135–145)
TOTAL PROTEIN: 6.6 g/dL (ref 6.5–8.1)

## 2018-03-08 LAB — CBC WITH DIFFERENTIAL/PLATELET
BASOS ABS: 0.1 10*3/uL (ref 0–0.1)
BASOS PCT: 1 %
EOS ABS: 0 10*3/uL (ref 0–0.7)
EOS PCT: 0 %
HEMATOCRIT: 39.4 % — AB (ref 40.0–52.0)
Hemoglobin: 13.3 g/dL (ref 13.0–18.0)
Lymphocytes Relative: 16 %
Lymphs Abs: 1.4 10*3/uL (ref 1.0–3.6)
MCH: 30.2 pg (ref 26.0–34.0)
MCHC: 33.7 g/dL (ref 32.0–36.0)
MCV: 89.5 fL (ref 80.0–100.0)
MONO ABS: 0.9 10*3/uL (ref 0.2–1.0)
MONOS PCT: 10 %
Neutro Abs: 6.3 10*3/uL (ref 1.4–6.5)
Neutrophils Relative %: 73 %
PLATELETS: 190 10*3/uL (ref 150–440)
RBC: 4.4 MIL/uL (ref 4.40–5.90)
RDW: 17.3 % — AB (ref 11.5–14.5)
WBC: 8.6 10*3/uL (ref 3.8–10.6)

## 2018-03-08 MED ORDER — SODIUM CHLORIDE 0.9 % IV SOLN: 1000.0000 mg | Freq: Once | INTRAVENOUS | Status: DC

## 2018-03-08 MED ORDER — DIPHENHYDRAMINE HCL 25 MG PO CAPS
50.0000 mg | ORAL_CAPSULE | Freq: Once | ORAL | Status: AC
  Administered 2018-03-08: 50 mg via ORAL

## 2018-03-08 MED ORDER — METHYLPREDNISOLONE SODIUM SUCC 125 MG IJ SOLR
100.0000 mg | Freq: Once | INTRAMUSCULAR | Status: AC
  Administered 2018-03-08: 100 mg via INTRAVENOUS

## 2018-03-08 MED ORDER — SODIUM CHLORIDE 0.9 % IV SOLN
1000.0000 mg | Freq: Once | INTRAVENOUS | Status: AC
  Administered 2018-03-08: 1000 mg via INTRAVENOUS
  Filled 2018-03-08: qty 100

## 2018-03-08 MED ORDER — SODIUM CHLORIDE 0.9 % IV SOLN
Freq: Once | INTRAVENOUS | Status: AC
  Administered 2018-03-08: 09:00:00 via INTRAVENOUS
  Filled 2018-03-08: qty 1000

## 2018-03-08 MED ORDER — ACETAMINOPHEN 325 MG PO TABS
650.0000 mg | ORAL_TABLET | Freq: Once | ORAL | Status: AC
  Administered 2018-03-08: 650 mg via ORAL

## 2018-03-08 NOTE — Progress Notes (Signed)
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  He had symptoms of renal syndrome which started in October 2018.  He initially had chronic dry cough, persistent dyspnea as well as petechial vasculitic rash in his lower extremities. CT scan showed groundglass opacities in his lungs and bronchoscopy was nondiagnostic.  Serology showed positive PR-3 ANCA.  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  Moderate arteriosclerosis with mild interstitial fibrosis and tubular atrophy.  Patient was started on high-dose steroids 60 mg and is currently on a steroid taper at 40 mg.  He is yet to start taking his PCP prophylaxis.  After starting steroids his symptoms of cough and shortness of breath as well as rash has resolved.  He continues to have significant fatigue which is his biggest complaint today.  He was evaluated by Dr. Lyndel Pleasure- bock from rheumatology and has been recommended induction treatment with Rituxan 1 dose x2 at 14-day interval.  He is here to receive the same.  Prior to onset of his symptoms in October 2018 he was quite and working out regularly.  His exercise capacity has gone down since his diagnosis.   Interval history- Patient saw [facility] nephrology and is currently on tapering dose of steroids decreasing by 10 mg Q2 weeks. He is on cal/vit D  and bactrim. He tolerated 1st infusion of rituxan well without significant side effects. He did have 1 episode of nose bleed for which he wwent to the ER and it resolved. He has had nose bleeds before which have been mild and self limited. Continues to have fatigue with no significant improvement  ECOG PS- 1 Pain scale- 0   Review of systems- Review of Systems  Constitutional: Positive for malaise/fatigue. Negative for chills, fever and weight loss.  HENT: Negative for congestion, ear discharge and nosebleeds.   Eyes: Negative for blurred vision.  Respiratory: Negative for cough, hemoptysis, sputum production, shortness of breath and wheezing.   Cardiovascular: Negative for chest pain, palpitations, orthopnea and claudication.  Gastrointestinal: Negative for abdominal pain, blood in stool, constipation, diarrhea, heartburn, melena, nausea and vomiting.  Genitourinary: Negative for dysuria, flank pain, frequency, hematuria and urgency.  Musculoskeletal: Negative for back pain, joint pain and myalgias.  Skin: Negative for rash.  Neurological: Negative for dizziness, tingling, focal weakness, seizures, weakness and headaches.  Endo/Heme/Allergies: Does not bruise/bleed easily.  Psychiatric/Behavioral: Negative for depression and suicidal ideas. The patient does not have insomnia.       Allergies  Allergen Reactions  . Bee Venom Anaphylaxis    Red fire ants     Past Medical History:  Diagnosis Date  . 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 Chronic pain   . Gout   . Gout   . Hypertension   . Skin cancer   . Thyroid disease      Past Surgical History:  Procedure Laterality Date  . CHOLECYSTECTOMY    .  GALLBLADDER SURGERY    . TOTAL HIP ARTHROPLASTY Right   . VIDEO BRONCHOSCOPY Bilateral 01/13/2018   Procedure: VIDEO BRONCHOSCOPY WITH FLUORO;  Surgeon: [name], MD;  Location: [facility] ORS;  Service: Cardiopulmonary;  Laterality: Bilateral;      Social History   Socioeconomic History  . Marital status: Married    Spouse name: Not on file  . Number of children: Not on file  . Years of education: Not on file  . Highest education level: Not on file  Occupational History  . Not on file  Social Needs  . Financial resource strain: Not on file  . Food insecurity:    Worry: Not on file    Inability: Not on file  . Transportation needs:    Medical: Not on file    Non-medical: Not on file  Tobacco Use  . Smoking status: Never Smoker  . Smokeless tobacco: Never Used  Substance and Sexual Activity  . Alcohol use: Yes  . Drug use: No  . Sexual activity: Not on file  Lifestyle  . Physical activity:    Days per week: Not on file    Minutes per session: Not on file  . Stress: Not on file  Relationships  . Social connections:    Talks on phone: Not on file    Gets together: Not on file    Attends religious service: Not on file    Active member of club or organization: Not on file    Attends meetings of clubs or organizations: Not on file    Relationship status: Not on file  . Intimate partner violence:    Fear of current or ex partner: Not on file    Emotionally abused: Not on file    Physically abused: Not on file    Forced sexual activity: Not on file  Other Topics Concern  . Not on file  Social History Narrative  . Not on file    Family History  Problem Relation Age of Onset  . Hypertension Other      Current Outpatient Medications:  .  albuterol (PROVENTIL HFA;VENTOLIN HFA) 108 (90 Base) MCG/ACT inhaler, Inhale 1-2 puffs into the lungs every 6 (six) hours as needed for wheezing or shortness of breath. , Disp: , Rfl:  .  allopurinol (ZYLOPRIM) 300 MG tablet, Take 300 mg by mouth daily., Disp: , Rfl:  .  aspirin EC 81 MG tablet, Take 81 mg by mouth daily., Disp: , Rfl:  .  calcium acetate (PHOSLO) 667 MG capsule, Take 600 mg by mouth 2 (two) times daily., Disp: , Rfl:  .  Cholecalciferol 400 units CAPS,  Take 400 Units by mouth daily. , Disp: , Rfl:  .  hydrochlorothiazide (MICROZIDE) 12.5 MG capsule, Take 12.5 mg by mouth daily., Disp: , Rfl:  .  levothyroxine (SYNTHROID, LEVOTHROID) 75 MCG tablet, Take 75 mcg by mouth daily., Disp: , Rfl:  .  lisinopril (PRINIVIL,ZESTRIL) 40 MG tablet, Take 40 mg by mouth daily., Disp: , Rfl:  .  pantoprazole (PROTONIX) 40 MG tablet, Take 1 tablet (40 mg total) by mouth daily., Disp: 30 tablet, Rfl: 0 .  pravastatin (PRAVACHOL) 20 MG tablet, Take 20 mg by mouth daily., Disp: , Rfl:  .  predniSONE (DELTASONE) 10 MG tablet, Take 10 mg by mouth 3 (three) times daily., Disp: , Rfl:  .  sulfamethoxazole-trimethoprim (BACTRIM DS,SEPTRA DS) 800-160 MG tablet, Take 1 tablet by mouth 3 (three) times a week., Disp: , Rfl:  .  tamsulosin (  FLOMAX) 0.4 MG CAPS capsule, Take 0.4 mg by mouth daily., Disp: , Rfl:  .  benzonatate (TESSALON PERLES) 100 MG capsule, Take 2 capsules (200 mg total) by mouth 3 (three) times daily. (Patient not taking: Reported on 03/08/2018), Disp: 90 capsule, Rfl: 3 .  predniSONE (DELTASONE) 10 MG tablet, Take 6 tablets (60 mg total) by mouth daily with breakfast. (Patient not taking: Reported on 03/08/2018), Disp: 30 tablet, Rfl: 0  Physical exam:  Vitals:   03/08/18 0847  BP: 124/73  Pulse: 81  Resp: 18  Temp: (!) 97.4 F (36.3 C)  TempSrc: Tympanic  SpO2: 97%  Weight: 238 lb 15.7 oz (108.4 kg)  Height: 6\' 1"  (1.854 m)   Physical Exam  Constitutional: He is oriented to person, place, and time. He appears well-developed and well-nourished.  HENT:  Head: Normocephalic and atraumatic.  Eyes: Pupils are equal, round, and reactive to light. EOM are normal.  Neck: Normal range of motion.  Cardiovascular: Normal rate, regular rhythm and normal heart sounds.  Pulmonary/Chest: Effort normal and breath sounds normal.  Abdominal: Soft. Bowel sounds are normal.  Neurological: He is alert and oriented to person, place, and time.  Skin: Skin is  warm and dry.     CMP Latest Ref Rng & Units 03/08/2018  Glucose 65 - 99 mg/dL 87  BUN 6 - 20 mg/dL 29(H)  Creatinine 0.61 - 1.24 mg/dL 1.06  Sodium 135 - 145 mmol/L 132(L)  Potassium 3.5 - 5.1 mmol/L 4.1  Chloride 101 - 111 mmol/L 93(L)  CO2 22 - 32 mmol/L 30  Calcium 8.9 - 10.3 mg/dL 9.4  Total Protein 6.5 - 8.1 g/dL 6.6  Total Bilirubin 0.3 - 1.2 mg/dL 0.7  Alkaline Phos 38 - 126 U/L 42  AST 15 - 41 U/L 28  ALT 17 - 63 U/L 43   CBC Latest Ref Rng & Units 03/08/2018  WBC 3.8 - 10.6 K/uL 8.6  Hemoglobin 13.0 - 18.0 g/dL 13.3  Hematocrit 40.0 - 52.0 % 39.4(L)  Platelets 150 - 440 K/uL 190    No images are attached to the encounter.  US Biopsy (kidney)  Result Date: 02/09/2018 INDICATION: Hematuria EXAM: ULTRASOUND-GUIDED RANDOM RENAL CORTEX CORE BIOPSY MEDICATIONS: None. ANESTHESIA/SEDATION: Fentanyl 50 mcg IV; Versed 2 mg IV Moderate Sedation Time:  13 minutes The patient was continuously monitored during the procedure by the interventional radiology nurse under my direct supervision. FLUOROSCOPY TIME:  Fluoroscopy Time:  minutes  seconds ( mGy). COMPLICATIONS: None immediate. PROCEDURE: Informed written consent was obtained from the patient after a thorough discussion of the procedural risks, benefits and alternatives. All questions were addressed. Maximal Sterile Barrier Technique was utilized including caps, mask, sterile gowns, sterile gloves, sterile drape, hand hygiene and skin antiseptic. A timeout was performed prior to the initiation of the procedure. The back was prepped with ChloraPrep in a sterile fashion, and a sterile drape was applied covering the operative field. A sterile gown and sterile gloves were used for the procedure. Under sonographic guidance, 2 16 gauge core biopsies of the left renal lower pole cortex were obtained. Final imaging was performed. Patient tolerated the procedure well without complication. Vital sign monitoring by nursing staff during the procedure  will continue as patient is in the special procedures unit for post procedure observation. FINDINGS: The images document guide needle placement within the left renal lower pole cortex. Post biopsy images demonstrate no hemorrhage. IMPRESSION: Successful ultrasound-guided random renal core cortex biopsy. Electronically Signed   By: Arnell Sieving  Hoss M.D.   On: 02/09/2018 12:13     Assessment and plan- Patient is a 72 y.o. male with PR3/ wegeners vasculitis here for dose 2 of rituxan  Counts ok to proceed with dose of rituxan at 1000 mg/meter square. He will receive solumedrol, tylenol and benadryl for pre meds  Further doses to be coordinated by Dr. Meda Coffee. I will see him back depending on her recommendations  Suspect nose bleeds may be due to wegeners. Continue to monitor   Visit Diagnosis 1. 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 Visit for monitoring Rituxan therapy      Dr. Randa Evens, MD, MPH [facility] [phone] 03/08/2018 3:27 PM

## 2018-03-08 NOTE — Progress Notes (Signed)
No new changes noted today 

## 2018-03-11 ENCOUNTER — Encounter: Payer: Self-pay | Admitting: Nephrology

## 2018-03-11 ENCOUNTER — Encounter: Payer: Self-pay | Admitting: Internal Medicine

## 2018-04-07 ENCOUNTER — Ambulatory Visit: Payer: Medicare Other | Attending: Internal Medicine

## 2018-04-07 DIAGNOSIS — R0683 Snoring: Secondary | ICD-10-CM | POA: Diagnosis present

## 2018-04-07 DIAGNOSIS — R400 Somnolence: Secondary | ICD-10-CM | POA: Insufficient documentation

## 2018-04-07 DIAGNOSIS — G4733 Obstructive sleep apnea (adult) (pediatric): Secondary | ICD-10-CM | POA: Diagnosis not present

## 2018-05-19 ENCOUNTER — Ambulatory Visit
Admission: RE | Admit: 2018-05-19 | Discharge: 2018-05-19 | Disposition: A | Discharge status: Home / Self Care | Payer: Medicare Other | Source: Ambulatory Visit | Attending: Internal Medicine | Admitting: Internal Medicine

## 2018-05-19 ENCOUNTER — Other Ambulatory Visit: Payer: Self-pay | Admitting: Internal Medicine

## 2018-05-19 DIAGNOSIS — R200 Anesthesia of skin: Secondary | ICD-10-CM | POA: Insufficient documentation

## 2018-05-19 DIAGNOSIS — M7989 Other specified soft tissue disorders: Secondary | ICD-10-CM | POA: Diagnosis present

## 2018-07-27 ENCOUNTER — Other Ambulatory Visit: Payer: Self-pay | Admitting: *Deleted

## 2018-07-27 DIAGNOSIS — M3131 Wegener's granulomatosis with renal involvement: Secondary | ICD-10-CM

## 2018-08-02 ENCOUNTER — Inpatient Hospital Stay: Payer: Medicare Other | Attending: Oncology | Admitting: Oncology

## 2018-08-02 ENCOUNTER — Inpatient Hospital Stay: Payer: Medicare Other

## 2018-08-02 ENCOUNTER — Encounter: Payer: Self-pay | Admitting: Oncology

## 2018-08-02 VITALS — BP 142/65 | HR 59 | Temp 97.2°F | Resp 18 | Ht 73.0 in | Wt 259.6 lb

## 2018-08-02 DIAGNOSIS — M7989 Other specified soft tissue disorders: Secondary | ICD-10-CM

## 2018-08-02 DIAGNOSIS — M3131 Wegener's granulomatosis with renal involvement: Secondary | ICD-10-CM

## 2018-08-02 DIAGNOSIS — Z79899 Other long term (current) drug therapy: Secondary | ICD-10-CM

## 2018-08-02 DIAGNOSIS — G629 Polyneuropathy, unspecified: Secondary | ICD-10-CM | POA: Insufficient documentation

## 2018-08-02 DIAGNOSIS — Z5181 Encounter for therapeutic drug level monitoring: Secondary | ICD-10-CM

## 2018-08-02 LAB — COMPREHENSIVE METABOLIC PANEL
ALBUMIN: 3.9 g/dL (ref 3.5–5.0)
ALK PHOS: 53 U/L (ref 38–126)
ALT: 22 U/L (ref 0–44)
AST: 26 U/L (ref 15–41)
Anion gap: 10 (ref 5–15)
BILIRUBIN TOTAL: 0.6 mg/dL (ref 0.3–1.2)
BUN: 19 mg/dL (ref 8–23)
CALCIUM: 9.3 mg/dL (ref 8.9–10.3)
CO2: 28 mmol/L (ref 22–32)
CREATININE: 1.14 mg/dL (ref 0.61–1.24)
Chloride: 101 mmol/L (ref 98–111)
GFR calc Af Amer: >60 mL/min (ref >60)
GFR calc non Af Amer: >60 mL/min (ref >60)
GLUCOSE: 104 mg/dL — AB (ref 70–99)
Potassium: 4 mmol/L (ref 3.5–5.1)
Sodium: 139 mmol/L (ref 135–145)
TOTAL PROTEIN: 7 g/dL (ref 6.5–8.1)

## 2018-08-02 LAB — CBC WITH DIFFERENTIAL/PLATELET
BASOS PCT: 1 %
Basophils Absolute: 0.1 10*3/uL (ref 0–0.1)
EOS ABS: 0.1 10*3/uL (ref 0–0.7)
EOS PCT: 1 %
HCT: 42.1 % (ref 40.0–52.0)
Hemoglobin: 14 g/dL (ref 13.0–18.0)
Lymphocytes Relative: 22 %
Lymphs Abs: 1 10*3/uL (ref 1.0–3.6)
MCH: 29.8 pg (ref 26.0–34.0)
MCHC: 33.3 g/dL (ref 32.0–36.0)
MCV: 89.6 fL (ref 80.0–100.0)
MONO ABS: 0.6 10*3/uL (ref 0.2–1.0)
Monocytes Relative: 13 %
Neutro Abs: 2.8 10*3/uL (ref 1.4–6.5)
Neutrophils Relative %: 63 %
PLATELETS: 174 10*3/uL (ref 150–440)
RBC: 4.7 MIL/uL (ref 4.40–5.90)
RDW: 14 % (ref 11.5–14.5)
WBC: 4.5 10*3/uL (ref 3.8–10.6)

## 2018-08-02 NOTE — Progress Notes (Signed)
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 He had symptoms of renal syndrome which started in October 2018. He initially had chronic dry cough, persistent dyspnea as well as petechial vasculitic rash in his lower extremities. CT scan showed groundglass opacities in his lungs and bronchoscopy was nondiagnostic. Serology showed positive PR-3 ANCA. 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 Moderate arteriosclerosis with mild interstitial fibrosis and tubular atrophy. Patient was started on high-dose steroids 60 mg and is currently on a steroid taper at 40 mg. He is yet to start taking his PCP prophylaxis. After starting steroids his symptoms of cough and shortness of breath as well as rash has resolved. He continues to have significant fatigue which is his biggest complaint today. He was evaluated by Dr. Lyndel Pleasure- bock from rheumatology and has been recommended induction treatment with Rituxan 1 dose x2 at 14-day interval. He is here to receive the same.  Prior to onset of his symptoms in October 2018 he was quite and working out regularly. His exercise capacity has gone down since his diagnosis.  Patient received 2 doses of rituxan in April 2019 with significant improvement in symptoms of wegeners  Interval history- reports that his  fatigue is better. Cough has almost resolved. He had 1 episode of nose bleed after rituxan treatment which resolved after packing. He has not had any since then. He has gradually developed some b/l leg swelling and tingling numbness in his toes over last few months.   ECOG PS- 1 Pain scale- 0 Opioid associated constipation- no  Review of systems- Review of Systems  Constitutional: Negative for chills, fever, malaise/fatigue and weight loss.  HENT: Negative for congestion, ear discharge and nosebleeds.   Eyes: Negative for blurred vision.  Respiratory: Negative for cough, hemoptysis, sputum production, shortness of breath and wheezing.   Cardiovascular: Positive for leg swelling. Negative for chest pain, palpitations, orthopnea and claudication.  Gastrointestinal: Negative for abdominal pain, blood in stool, constipation, diarrhea, heartburn, melena, nausea and vomiting.  Genitourinary: Negative for dysuria, flank pain, frequency, hematuria and urgency.  Musculoskeletal: Negative for back pain, joint pain and myalgias.  Skin: Negative for rash.  Neurological: Positive for tingling. Negative for dizziness, focal weakness, seizures, weakness and headaches.  Endo/Heme/Allergies: Does not bruise/bleed easily.  Psychiatric/Behavioral: Negative for depression and suicidal ideas. The patient does not have insomnia.      Allergies  Allergen Reactions  . Bee Venom Anaphylaxis    Red fire ants     Past Medical History:  Diagnosis Date  . 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 Chronic pain   . Gout   . Gout   . Hypertension   . Skin cancer   . Thyroid disease      Past Surgical History:  Procedure Laterality Date  . CHOLECYSTECTOMY    . GALLBLADDER SURGERY    . TOTAL HIP ARTHROPLASTY Right   . VIDEO BRONCHOSCOPY  Bilateral 01/13/2018   Procedure: VIDEO BRONCHOSCOPY WITH FLUORO;  Surgeon: [name], MD;  Location: [facility] ORS;  Service:  Cardiopulmonary;  Laterality: Bilateral;    Social History   Socioeconomic History  . Marital status: Married    Spouse name: Not on file  . Number of children: Not on file  . Years of education: Not on file  . Highest education level: Not on file  Occupational History  . Not on file  Social Needs  . Financial resource strain: Not on file  . Food insecurity:    Worry: Not on file    Inability: Not on file  . Transportation needs:    Medical: Not on file    Non-medical: Not on file  Tobacco Use  . Smoking status: Never Smoker  . Smokeless tobacco: Never Used  Substance and Sexual Activity  . Alcohol use: Yes  . Drug use: No  . Sexual activity: Not on file  Lifestyle  . Physical activity:    Days per week: Not on file    Minutes per session: Not on file  . Stress: Not on file  Relationships  . Social connections:    Talks on phone: Not on file    Gets together: Not on file    Attends religious service: Not on file    Active member of club or organization: Not on file    Attends meetings of clubs or organizations: Not on file    Relationship status: Not on file  . Intimate partner violence:    Fear of current or ex partner: Not on file    Emotionally abused: Not on file    Physically abused: Not on file    Forced sexual activity: Not on file  Other Topics Concern  . Not on file  Social History Narrative  . Not on file    Family History  Problem Relation Age of Onset  . Hypertension Other      Current Outpatient Medications:  .  allopurinol (ZYLOPRIM) 300 MG tablet, Take 300 mg by mouth daily., Disp: , Rfl:  .  aspirin EC 81 MG tablet, Take 81 mg by mouth daily., Disp: , Rfl:  .  calcium acetate (PHOSLO) 667 MG capsule, Take 600 mg by mouth 2 (two) times daily., Disp: , Rfl:  .  Cholecalciferol 400 units CAPS, Take 400 Units by mouth daily. , Disp: , Rfl:  .  furosemide (LASIX) 20 MG tablet, Take by mouth., Disp: , Rfl:  .  hydrochlorothiazide (MICROZIDE)  12.5 MG capsule, Take 12.5 mg by mouth daily., Disp: , Rfl:  .  levothyroxine (SYNTHROID, LEVOTHROID) 75 MCG tablet, Take 75 mcg by mouth daily., Disp: , Rfl:  .  lisinopril (PRINIVIL,ZESTRIL) 40 MG tablet, Take 40 mg by mouth daily., Disp: , Rfl:  .  pravastatin (PRAVACHOL) 20 MG tablet, Take 20 mg by mouth daily., Disp: , Rfl:  .  tamsulosin (FLOMAX) 0.4 MG CAPS capsule, Take 0.4 mg by mouth daily., Disp: , Rfl:  .  albuterol (PROVENTIL HFA;VENTOLIN HFA) 108 (90 Base) MCG/ACT inhaler, Inhale 1-2 puffs into the lungs every 6 (six) hours as needed for wheezing or shortness of breath. , Disp: , Rfl:  .  benzonatate (TESSALON PERLES) 100 MG capsule, Take 2 capsules (200 mg total) by mouth 3 (three) times daily. (Patient not taking: Reported on 03/08/2018), Disp: 90 capsule, Rfl: 3 .  pantoprazole (PROTONIX) 40 MG tablet, Take 1 tablet (40 mg total) by mouth daily. (Patient not taking: Reported  on 08/02/2018), Disp: 30 tablet, Rfl: 0 .  predniSONE (DELTASONE) 10 MG tablet, Take 6 tablets (60 mg total) by mouth daily with breakfast. (Patient not taking: Reported on 03/08/2018), Disp: 30 tablet, Rfl: 0 .  predniSONE (DELTASONE) 10 MG tablet, Take 10 mg by mouth 3 (three) times daily., Disp: , Rfl:  .  sulfamethoxazole-trimethoprim (BACTRIM DS,SEPTRA DS) 800-160 MG tablet, Take 1 tablet by mouth 3 (three) times a week., Disp: , Rfl:   Physical exam:  Vitals:   08/02/18 0854  BP: (!) 142/65  Pulse: (!) 59  Resp: 18  Temp: (!) 97.2 F (36.2 C)  TempSrc: Tympanic  SpO2: 99%  Weight: 259 lb 9.5 oz (117.8 kg)  Height: 6\' 1"  (1.854 m)   Physical Exam  Constitutional: He is oriented to person, place, and time. He appears well-developed and well-nourished.  HENT:  Head: Normocephalic and atraumatic.  Eyes: Pupils are equal, round, and reactive to light. EOM are normal.  Neck: Normal range of motion.  Cardiovascular: Normal rate, regular rhythm and normal heart sounds.  Pulmonary/Chest: Effort normal  and breath sounds normal.  Abdominal: Soft. Bowel sounds are normal.  Musculoskeletal: He exhibits edema (b/l +1).  Neurological: He is alert and oriented to person, place, and time.  Skin: Skin is warm and dry.     CMP Latest Ref Rng & Units 08/02/2018  Glucose 70 - 99 mg/dL 104(H)  BUN 8 - 23 mg/dL 19  Creatinine 0.61 - 1.24 mg/dL 1.14  Sodium 135 - 145 mmol/L 139  Potassium 3.5 - 5.1 mmol/L 4.0  Chloride 98 - 111 mmol/L 101  CO2 22 - 32 mmol/L 28  Calcium 8.9 - 10.3 mg/dL 9.3  Total Protein 6.5 - 8.1 g/dL 7.0  Total Bilirubin 0.3 - 1.2 mg/dL 0.6  Alkaline Phos 38 - 126 U/L 53  AST 15 - 41 U/L 26  ALT 0 - 44 U/L 22   CBC Latest Ref Rng & Units 08/02/2018  WBC 3.8 - 10.6 K/uL 4.5  Hemoglobin 13.0 - 18.0 g/dL 14.0  Hematocrit 40.0 - 52.0 % 42.1  Platelets 150 - 440 K/uL 174      Assessment and plan- Patient is a 72 y.o. male with PR3/ wegeners vasculitis here for next dose of rituxan maintenance treatment  Patient is due for his next dose of maintenance Rituxan sometime in October 2019 but he is going out of town.  Therefore Dr. Meda Coffee once him to get Rituxan around 08/08/2018.  He did get his CBC and CMP checked today which are unremarkable.  We will plan to give him thousand milligrams of Rituxan along with Tylenol Benadryl and 100 mg of Solu-Medrol with his next dose which would be on 08/09/2018.  Overall patient has tolerated Rituxan well in the past without significant side effects.  I do not think that the patient's symptoms of leg swelling and neuropathy would be secondary to Rituxan and he should undergo neurologic work-up as deemed appropriate by primary team.  I will see him back when he is due for his second dose of maintenance Rituxan at the discretion of rheumatology   Visit Diagnosis 1. 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 Visit for monitoring Rituxan therapy      Dr. Randa Evens, MD, MPH [facility] at [facility] [phone] 08/02/2018 9:13 AM

## 2018-08-03 DIAGNOSIS — E782 Mixed hyperlipidemia: Secondary | ICD-10-CM | POA: Insufficient documentation

## 2018-08-09 ENCOUNTER — Inpatient Hospital Stay: Payer: Medicare Other

## 2018-08-09 VITALS — BP 153/85 | HR 53 | Temp 95.1°F | Resp 18

## 2018-08-09 DIAGNOSIS — M3131 Wegener's granulomatosis with renal involvement: Secondary | ICD-10-CM | POA: Diagnosis not present

## 2018-08-09 MED ORDER — SODIUM CHLORIDE 0.9 % IV SOLN
Freq: Once | INTRAVENOUS | Status: AC
  Administered 2018-08-09: 09:00:00 via INTRAVENOUS
  Filled 2018-08-09: qty 250

## 2018-08-09 MED ORDER — SODIUM CHLORIDE 0.9 % IV SOLN
1000.0000 mg | Freq: Once | INTRAVENOUS | Status: AC
  Administered 2018-08-09: 1000 mg via INTRAVENOUS
  Filled 2018-08-09: qty 100

## 2018-08-09 MED ORDER — DIPHENHYDRAMINE HCL 25 MG PO CAPS
50.0000 mg | ORAL_CAPSULE | Freq: Once | ORAL | Status: AC
  Administered 2018-08-09: 50 mg via ORAL
  Filled 2018-08-09: qty 2

## 2018-08-09 MED ORDER — ACETAMINOPHEN 325 MG PO TABS
650.0000 mg | ORAL_TABLET | Freq: Once | ORAL | Status: AC
  Administered 2018-08-09: 650 mg via ORAL
  Filled 2018-08-09: qty 2

## 2018-08-09 MED ORDER — METHYLPREDNISOLONE SODIUM SUCC 125 MG IJ SOLR
100.0000 mg | Freq: Once | INTRAMUSCULAR | Status: AC
  Administered 2018-08-09: 100 mg via INTRAVENOUS
  Filled 2018-08-09: qty 2

## 2018-08-09 MED ORDER — SODIUM CHLORIDE 0.9 % IV SOLN: 1000.0000 mg | Freq: Once | INTRAVENOUS | Status: DC

## 2018-08-09 NOTE — Patient Instructions (Signed)

## 2018-09-28 DIAGNOSIS — G609 Hereditary and idiopathic neuropathy, unspecified: Secondary | ICD-10-CM | POA: Insufficient documentation

## 2018-12-08 ENCOUNTER — Other Ambulatory Visit: Payer: Self-pay | Admitting: Hematology and Oncology

## 2018-12-08 ENCOUNTER — Encounter: Payer: Self-pay | Admitting: Internal Medicine

## 2018-12-28 DIAGNOSIS — H938X1 Other specified disorders of right ear: Secondary | ICD-10-CM | POA: Insufficient documentation

## 2018-12-29 ENCOUNTER — Other Ambulatory Visit (HOSPITAL_COMMUNITY): Payer: Self-pay | Admitting: Internal Medicine

## 2018-12-29 ENCOUNTER — Other Ambulatory Visit: Payer: Self-pay | Admitting: Internal Medicine

## 2018-12-29 DIAGNOSIS — M3130 Wegener's granulomatosis without renal involvement: Secondary | ICD-10-CM

## 2018-12-29 DIAGNOSIS — I776 Arteritis, unspecified: Secondary | ICD-10-CM

## 2018-12-29 DIAGNOSIS — H938X1 Other specified disorders of right ear: Secondary | ICD-10-CM

## 2018-12-31 ENCOUNTER — Ambulatory Visit
Admission: RE | Admit: 2018-12-31 | Discharge: 2018-12-31 | Disposition: A | Discharge status: Home / Self Care | Payer: Medicare Other | Source: Ambulatory Visit | Attending: Internal Medicine | Admitting: Internal Medicine

## 2018-12-31 DIAGNOSIS — H938X1 Other specified disorders of right ear: Secondary | ICD-10-CM | POA: Insufficient documentation

## 2018-12-31 DIAGNOSIS — M3130 Wegener's granulomatosis without renal involvement: Secondary | ICD-10-CM | POA: Diagnosis present

## 2018-12-31 DIAGNOSIS — I776 Arteritis, unspecified: Secondary | ICD-10-CM | POA: Insufficient documentation

## 2019-02-04 ENCOUNTER — Other Ambulatory Visit: Payer: Self-pay

## 2019-02-04 DIAGNOSIS — M3131 Wegener's granulomatosis with renal involvement: Secondary | ICD-10-CM

## 2019-02-05 ENCOUNTER — Other Ambulatory Visit: Payer: Self-pay | Admitting: Hematology and Oncology

## 2019-02-05 NOTE — Progress Notes (Signed)
[facility] [address], [place], [place] [address] Phone: [phone]  Fax: [phone]    Clinic day:  02/07/2019  Chief Complaint: Logan Murphy is a 73 y.o. male with Wegener's granulomatosis who is seen for new patient assessment and continuation of Rituxan.  HPI:   The patient was diagnosed with Wegener's granulomatosis in 12/2017. He noted "total exhaustion" x 4 months and recurrent multilobar pneumonia.  He had hematuria.  He developed renal syndrome in 08/2017. He presented with a chronic dry cough, persistent dyspnea, hematuria, fatigue,  and a  petechial vasculitic rash on his lower extremities. CT scan revealed groundglass opacities in his lungs.  Bronchoscopy on 01/13/2018 was nondiagnostic. 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 Patient continues to be followed monthly at [facility]   He underwent renal biopsy on 02/09/2018.  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  There was moderate arteriosclerosis with mild interstitial fibrosis and tubular atrophy.   He was started on prednsione 60 mg/day.  After starting steroids, his cough, shortness of breath, and rash resolved. His biggest complaint was then fatigue. His exercise capacity diminished.  He was evaluated by Dr. Annalee Genta, rheumatology, who recommended induction treatment with Rituxan 1 dose x  at 14-day interval.  He received Rituxan 1000 mg x 2 (02/22/2018 and 03/08/2018) with significant improvement in symptoms.  The patient was last seen by Dr. Janese Banks on 08/02/2018.  At that time, his fatigue was better.  Cough had almost resolved. He described 1 episode of epistaxis after Rituxan which resolved after packing.  He has not had any recurrence. He has gradually developed some BILATERAL leg swelling and tingling numbness in his toes over last few months.  He received Rituxan 1000 mg on 08/09/2018.  CBC on  12/07/2018 included a hematocrit of 45.4, hemoglobin 15.4, platelets 211,000, white count 6300 with an [place] of 4370.  Metabolic panel creatinine of 1.1.  Liver function tests were normal.  Urinalysis was negative.  PSA was 4.95 (high).  TSH was 6.201 (high).  Symptomatically, patient feels "good". He has neuropathy in his feet. He has been seen by neurology at [facility] and has undergone EMG testing that demonstrated "slowed conduction". He will see neurology in follow up towards the end of this month. Patient denies that he has experienced any B symptoms. He denies any interval infections.   He denies headaches and visual changes. He has an intermittent cough due to seasonal allergies. He denies rash. No gross hematuria appreciated. Previously reported peripheral edema has resolved.   Patient advises that he maintains an adequate appetite. He is eating well. Weight today is 249 lb 7.2 oz (113.2 kg), which compared to his last visit to the clinic, represents a 10 pound weight loss.  Patient denies pain in the clinic today.   Past Medical History:  Diagnosis Date  . 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 Chronic pain   . Gout   . Gout   . Hypertension   . Skin cancer   . Thyroid disease     Past Surgical History:  Procedure Laterality Date  . CHOLECYSTECTOMY    . GALLBLADDER SURGERY    . TOTAL HIP ARTHROPLASTY Right   . VIDEO BRONCHOSCOPY Bilateral 01/13/2018   Procedure: VIDEO BRONCHOSCOPY WITH FLUORO;  Surgeon: [name], MD;  Location: [facility] ORS;  Service: Cardiopulmonary;  Laterality: Bilateral;    Family History  Problem Relation Age of Onset  .  Hypertension Other     Social History:  reports that he has never smoked. He has never used smokeless tobacco. He reports current alcohol use. He reports that he does not use drugs.  He is retired from [employer].  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  He lives in [place].  The patient  is alone today.  Allergies:  Allergies  Allergen Reactions  . Bee Venom Anaphylaxis    Red fire ants    Current Medications: Current Outpatient Medications  Medication Sig Dispense Refill  . allopurinol (ZYLOPRIM) 300 MG tablet Take 300 mg by mouth daily.    Marland Kitchen aspirin EC 81 MG tablet Take 81 mg by mouth daily.    . calcium carbonate (OS-CAL) 600 MG tablet Take 600 mg by mouth daily.     . Cholecalciferol 400 units CAPS Take 400 Units by mouth daily.     . DULoxetine (CYMBALTA) 30 MG capsule Take 1 capsule by mouth daily.    . furosemide (LASIX) 20 MG tablet Take 20 mg by mouth every other day.     . hydrochlorothiazide (MICROZIDE) 12.5 MG capsule Take 12.5 mg by mouth daily.    Marland Kitchen levothyroxine (SYNTHROID, LEVOTHROID) 88 MCG tablet Take 88 mcg by mouth daily before breakfast.     . lisinopril (PRINIVIL,ZESTRIL) 40 MG tablet Take 40 mg by mouth daily.    . pravastatin (PRAVACHOL) 20 MG tablet Take 20 mg by mouth daily.    . tamsulosin (FLOMAX) 0.4 MG CAPS capsule Take 0.4 mg by mouth daily.     No current facility-administered medications for this visit.     Review of Systems:  GENERAL:  Feels good.  Active.  No fevers, sweats or weight loss. PERFORMANCE STATUS (ECOG): 0 HEENT:  (+) seasonal allergies. No visual changes, runny nose, sore throat, mouth sores or tenderness. Lungs: (+) cough related to allergies. No shortness of breath.  No hemoptysis. Cardiac:  No chest pain, palpitations, orthopnea, or PND. GI:  No nausea, vomiting, diarrhea, constipation, melena or hematochezia. GU:  No urgency, frequency, dysuria, or hematuria. Musculoskeletal:  No back pain.  No joint pain.  No muscle tenderness. Extremities:  No pain or swelling (resolved). Skin:  No rashes or skin changes. Neuro:  (+) neuropathy in toes; EMG shows slowed conduction. No headache, numbness or weakness, balance or coordination issues. Endocrine:  No diabetes, thyroid issues, hot flashes or night sweats. Psych:   No mood changes, depression or anxiety. Pain:  No focal pain. Review of systems:  All other systems reviewed and found to be negative.  Physical Exam: Blood pressure 122/73, pulse 70, temperature (!) 97 F (36.1 C), temperature source Tympanic, resp. rate 16, weight 249 lb 7.2 oz (113.2 kg), SpO2 98 %. GENERAL:  Well developed, well nourished, man sitting comfortably in the exam room in no acute distress. MENTAL STATUS:  Alert and oriented to person, place and time. HEAD:  Short gray hair.  Male pattern baldness.  Normocephalic, atraumatic, face symmetric, no Cushingoid features. EYES:  Pupils equal round and reactive to light and accomodation.  No conjunctivitis or scleral icterus. ENT:  Oropharynx clear without lesion.  Tongue normal. Mucous membranes moist.  RESPIRATORY:  Clear to auscultation without rales, wheezes or rhonchi. CARDIOVASCULAR:  Regular rate and rhythm without murmur, rub or gallop. ABDOMEN:  Soft, non-tender, with active bowel sounds, and no hepatosplenomegaly.  No masses. SKIN:  No rashes, ulcers or lesions. EXTREMITIES: No edema, no skin discoloration or tenderness.  No  palpable cords. LYMPH NODES: No palpable cervical, supraclavicular, axillary or inguinal adenopathy  NEUROLOGICAL: Unremarkable. PSYCH:  Appropriate.   Infusion on 02/07/2019  Component Date Value Ref Range Status  . Sodium 02/07/2019 136  135 - 145 mmol/L Final  . Potassium 02/07/2019 3.6  3.5 - 5.1 mmol/L Final  . Chloride 02/07/2019 100  98 - 111 mmol/L Final  . CO2 02/07/2019 30  22 - 32 mmol/L Final  . Glucose, Bld 02/07/2019 97  70 - 99 mg/dL Final  . BUN 02/07/2019 21  8 - 23 mg/dL Final  . Creatinine, Ser 02/07/2019 0.94  0.61 - 1.24 mg/dL Final  . Calcium 02/07/2019 9.2  8.9 - 10.3 mg/dL Final  . Total Protein 02/07/2019 7.2  6.5 - 8.1 g/dL Final  . Albumin 02/07/2019 4.0  3.5 - 5.0 g/dL Final  . AST 02/07/2019 23  15 - 41 U/L Final  . ALT 02/07/2019 28  0 - 44 U/L Final  . Alkaline  Phosphatase 02/07/2019 61  38 - 126 U/L Final  . Total Bilirubin 02/07/2019 1.0  0.3 - 1.2 mg/dL Final  . GFR calc non Af Amer 02/07/2019 >60  >60 mL/min Final  . GFR calc Af Amer 02/07/2019 >60  >60 mL/min Final  . Anion gap 02/07/2019 6  5 - 15 Final   Performed at [facility], [address], [place], [place] [address]  . WBC 02/07/2019 5.5  4.0 - 10.5 K/uL Final  . RBC 02/07/2019 4.61  4.22 - 5.81 MIL/uL Final  . Hemoglobin 02/07/2019 14.3  13.0 - 17.0 g/dL Final  . HCT 02/07/2019 42.1  39.0 - 52.0 % Final  . MCV 02/07/2019 91.3  80.0 - 100.0 fL Final  . MCH 02/07/2019 31.0  26.0 - 34.0 pg Final  . MCHC 02/07/2019 34.0  30.0 - 36.0 g/dL Final  . RDW 02/07/2019 13.7  11.5 - 15.5 % Final  . Platelets 02/07/2019 204  150 - 400 K/uL Final  . nRBC 02/07/2019 0.0  0.0 - 0.2 % Final  . Neutrophils Relative % 02/07/2019 66  % Final  . Neutro Abs 02/07/2019 3.7  1.7 - 7.7 K/uL Final  . Lymphocytes Relative 02/07/2019 17  % Final  . Lymphs Abs 02/07/2019 1.0  0.7 - 4.0 K/uL Final  . Monocytes Relative 02/07/2019 13  % Final  . Monocytes Absolute 02/07/2019 0.7  0.1 - 1.0 K/uL Final  . Eosinophils Relative 02/07/2019 2  % Final  . Eosinophils Absolute 02/07/2019 0.1  0.0 - 0.5 K/uL Final  . Basophils Relative 02/07/2019 1  % Final  . Basophils Absolute 02/07/2019 0.0  0.0 - 0.1 K/uL Final  . Immature Granulocytes 02/07/2019 1  % Final  . Abs Immature Granulocytes 02/07/2019 0.03  0.00 - 0.07 K/uL Final   Performed at [facility], [address] [place], [place] [address]    Assessment:  Logan Murphy is a 73 y.o. male with Wegner's granulomatosis diagnosed in 12/2017. He developed renal syndrome in 08/2017. He presented with a chronic dry cough, persistent dyspnea and a  petechial vasculitic rash on his lower extremities. CT scan revealed groundglass opacities in his lungs.  Bronchoscopy was nondiagnostic. 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  There was moderate arteriosclerosis with mild interstitial fibrosis and tubular atrophy.   He was started on prednisone 60 mg a day.  Hepatitis  B testing was negative on 12/31/2017.  He received Rituxan 1000 mg x 2 (02/22/2018 and 03/08/2018) with significant improvement in symptoms.  He last received Rituxan on 09/23/20219.  Symptomatically, patient is doing well overall. He denies B symptoms and recent infections. Patient complains of neuropathy in his feet. He is being followed by neurology at [facility]. Eating well; no weight loss. Exam is grossly unremarkable.   Plan: 1. Labs today:  CBC with diff, CMP, hepatitis B surface antigen, hepatitis B core antibody total, hepatitis C antibody. 2. Wegener's granulomatosis  Discuss diagnosis and management of Wegener's granulomatosis  Discuss prior heptatitis testing and plan for recheck yearly.    Discussed with Dr. Jefm Bryant.  Cleared for treatment today.  Treatment directed by rheumatology.    Discuss role of infusion center.  Review potential side effects: infusion reaction, reactivation of hepatitis B, infection, progressive multifocal leukoencephalopathy (PML), and leukopenia.    Patient consents to treatment.  Rituxan 1000 mg IV today.  Dose confirmed with rheumatology.  Premeds:  Benadryl 50 mg PO, Tylenol 650 mg PO, and Solumedrol 100 mg IV. 3.   RTC in 6 months for MD assessment, labs (CBC with diff, CMP), and Rituxan.   Honor Loh, NP  02/07/2019, 8:55 AM   I saw and evaluated the patient, participating in the key portions of the service and reviewing pertinent diagnostic studies and records.  I reviewed the nurse practitioner's note and agree with the findings and the plan.  The assessment and plan were discussed with the patient.  Several questions were asked by the patient and answered.   Nolon Stalls, MD 02/07/2019,8:55 AM

## 2019-02-06 ENCOUNTER — Other Ambulatory Visit: Payer: Self-pay

## 2019-02-07 ENCOUNTER — Telehealth: Payer: Self-pay

## 2019-02-07 ENCOUNTER — Other Ambulatory Visit: Payer: Self-pay

## 2019-02-07 ENCOUNTER — Inpatient Hospital Stay: Payer: Medicare Other | Attending: Hematology and Oncology | Admitting: Hematology and Oncology

## 2019-02-07 ENCOUNTER — Inpatient Hospital Stay: Payer: Medicare Other

## 2019-02-07 VITALS — BP 155/78 | HR 69 | Resp 18

## 2019-02-07 VITALS — BP 122/73 | HR 70 | Temp 97.0°F | Resp 16 | Wt 249.5 lb

## 2019-02-07 DIAGNOSIS — G629 Polyneuropathy, unspecified: Secondary | ICD-10-CM | POA: Diagnosis not present

## 2019-02-07 DIAGNOSIS — M3130 Wegener's granulomatosis without renal involvement: Secondary | ICD-10-CM | POA: Insufficient documentation

## 2019-02-07 DIAGNOSIS — Z79899 Other long term (current) drug therapy: Secondary | ICD-10-CM | POA: Insufficient documentation

## 2019-02-07 DIAGNOSIS — E079 Disorder of thyroid, unspecified: Secondary | ICD-10-CM | POA: Diagnosis not present

## 2019-02-07 DIAGNOSIS — I10 Essential (primary) hypertension: Secondary | ICD-10-CM | POA: Diagnosis not present

## 2019-02-07 DIAGNOSIS — M3131 Wegener's granulomatosis with renal involvement: Secondary | ICD-10-CM

## 2019-02-07 DIAGNOSIS — Z5112 Encounter for antineoplastic immunotherapy: Secondary | ICD-10-CM | POA: Insufficient documentation

## 2019-02-07 DIAGNOSIS — Z7982 Long term (current) use of aspirin: Secondary | ICD-10-CM | POA: Insufficient documentation

## 2019-02-07 LAB — CBC WITH DIFFERENTIAL/PLATELET
Abs Immature Granulocytes: 0.03 10*3/uL (ref 0.00–0.07)
Basophils Absolute: 0 10*3/uL (ref 0.0–0.1)
Basophils Relative: 1 %
Eosinophils Absolute: 0.1 10*3/uL (ref 0.0–0.5)
Eosinophils Relative: 2 %
HCT: 42.1 % (ref 39.0–52.0)
Hemoglobin: 14.3 g/dL (ref 13.0–17.0)
Immature Granulocytes: 1 %
Lymphocytes Relative: 17 %
Lymphs Abs: 1 10*3/uL (ref 0.7–4.0)
MCH: 31 pg (ref 26.0–34.0)
MCHC: 34 g/dL (ref 30.0–36.0)
MCV: 91.3 fL (ref 80.0–100.0)
Monocytes Absolute: 0.7 10*3/uL (ref 0.1–1.0)
Monocytes Relative: 13 %
Neutro Abs: 3.7 10*3/uL (ref 1.7–7.7)
Neutrophils Relative %: 66 %
Platelets: 204 10*3/uL (ref 150–400)
RBC: 4.61 MIL/uL (ref 4.22–5.81)
RDW: 13.7 % (ref 11.5–15.5)
WBC: 5.5 10*3/uL (ref 4.0–10.5)
nRBC: 0 % (ref 0.0–0.2)

## 2019-02-07 LAB — COMPREHENSIVE METABOLIC PANEL
ALT: 28 U/L (ref 0–44)
AST: 23 U/L (ref 15–41)
Albumin: 4 g/dL (ref 3.5–5.0)
Alkaline Phosphatase: 61 U/L (ref 38–126)
Anion gap: 6 (ref 5–15)
BUN: 21 mg/dL (ref 8–23)
CO2: 30 mmol/L (ref 22–32)
Calcium: 9.2 mg/dL (ref 8.9–10.3)
Chloride: 100 mmol/L (ref 98–111)
Creatinine, Ser: 0.94 mg/dL (ref 0.61–1.24)
GFR calc Af Amer: >60 mL/min (ref >60)
GFR calc non Af Amer: >60 mL/min (ref >60)
Glucose, Bld: 97 mg/dL (ref 70–99)
Potassium: 3.6 mmol/L (ref 3.5–5.1)
Sodium: 136 mmol/L (ref 135–145)
Total Bilirubin: 1 mg/dL (ref 0.3–1.2)
Total Protein: 7.2 g/dL (ref 6.5–8.1)

## 2019-02-07 MED ORDER — DIPHENHYDRAMINE HCL 25 MG PO CAPS
50.0000 mg | ORAL_CAPSULE | Freq: Once | ORAL | Status: AC
  Administered 2019-02-07: 50 mg via ORAL
  Filled 2019-02-07: qty 2

## 2019-02-07 MED ORDER — ACETAMINOPHEN 325 MG PO TABS
650.0000 mg | ORAL_TABLET | Freq: Once | ORAL | Status: AC
  Administered 2019-02-07: 650 mg via ORAL
  Filled 2019-02-07: qty 2

## 2019-02-07 MED ORDER — METHYLPREDNISOLONE SODIUM SUCC 125 MG IJ SOLR
100.0000 mg | Freq: Once | INTRAMUSCULAR | Status: AC
  Administered 2019-02-07: 100 mg via INTRAVENOUS
  Filled 2019-02-07: qty 2

## 2019-02-07 MED ORDER — SODIUM CHLORIDE 0.9 % IV SOLN
Freq: Once | INTRAVENOUS | Status: AC
  Administered 2019-02-07: 09:00:00 via INTRAVENOUS
  Filled 2019-02-07: qty 250

## 2019-02-07 MED ORDER — SODIUM CHLORIDE 0.9 % IV SOLN
1000.0000 mg | Freq: Once | INTRAVENOUS | Status: AC
  Administered 2019-02-07: 1000 mg via INTRAVENOUS
  Filled 2019-02-07: qty 100

## 2019-02-07 NOTE — Telephone Encounter (Signed)
Spoke with Logan Murphy, Dr. Shela Commons nurse regarding RX for Rituxan. Informed her note states patient is to receive 1,000 MG of Rituxan but we received RX for 500 MG. Nurse confirms patient is to be taking 1,000 MG and states she will fax over new RX.

## 2019-02-07 NOTE — Patient Instructions (Signed)
Rituximab injection  What is this medicine?  RITUXIMAB (ri TUX i mab) is a monoclonal antibody. It is used to treat certain types of cancer like non-Hodgkin lymphoma and chronic lymphocytic leukemia. It is also used to treat rheumatoid arthritis, granulomatosis with polyangiitis (or Wegener's granulomatosis), microscopic polyangiitis, and pemphigus vulgaris.  This medicine may be used for other purposes; ask your health care provider or pharmacist if you have questions.  COMMON BRAND NAME(S): Rituxan  What should I tell my health care provider before I take this medicine?  They need to know if you have any of these conditions:  -heart disease  -infection (especially a virus infection such as hepatitis B, chickenpox, cold sores, or herpes)  -immune system problems  -irregular heartbeat  -kidney disease  -low blood counts, like low white cell, platelet, or red cell counts  -lung or breathing disease, like asthma  -recently received or scheduled to receive a vaccine  -an unusual or allergic reaction to rituximab, other medicines, foods, dyes, or preservatives  -pregnant or trying to get pregnant  -breast-feeding  How should I use this medicine?  This medicine is for infusion into a vein. It is administered in a hospital or clinic by a specially trained health care professional.  A special MedGuide will be given to you by the pharmacist with each prescription and refill. Be sure to read this information carefully each time.  Talk to your pediatrician regarding the use of this medicine in children. This medicine is not approved for use in children.  Overdosage: If you think you have taken too much of this medicine contact a poison control center or emergency room at once.  NOTE: This medicine is only for you. Do not share this medicine with others.  What if I miss a dose?  It is important not to miss a dose. Call your doctor or health care professional if you are unable to keep an appointment.  What may interact with  this medicine?  -cisplatin  -live virus vaccines  This list may not describe all possible interactions. Give your health care provider a list of all the medicines, herbs, non-prescription drugs, or dietary supplements you use. Also tell them if you smoke, drink alcohol, or use illegal drugs. Some items may interact with your medicine.  What should I watch for while using this medicine?  Your condition will be monitored carefully while you are receiving this medicine. You may need blood work done while you are taking this medicine.  This medicine can cause serious allergic reactions. To reduce your risk you may need to take medicine before treatment with this medicine. Take your medicine as directed.  In some patients, this medicine may cause a serious brain infection that may cause death. If you have any problems seeing, thinking, speaking, walking, or standing, tell your healthcare professional right away. If you cannot reach your healthcare professional, urgently seek other source of medical care.  Call your doctor or health care professional for advice if you get a fever, chills or sore throat, or other symptoms of a cold or flu. Do not treat yourself. This drug decreases your body's ability to fight infections. Try to avoid being around people who are sick.  Do not become pregnant while taking this medicine or for 12 months after stopping it. Women should inform their doctor if they wish to become pregnant or think they might be pregnant. There is a potential for serious side effects to an unborn child. Talk to your health   care professional or pharmacist for more information. Do not breast-feed an infant while taking this medicine or for 6 months after stopping it.  What side effects may I notice from receiving this medicine?  Side effects that you should report to your doctor or health care professional as soon as possible:  -allergic reactions like skin rash, itching or hives; swelling of the face, lips, or  tongue  -breathing problems  -chest pain  -changes in vision  -diarrhea  -headache with fever, neck stiffness, sensitivity to light, nausea, or confusion  -fast, irregular heartbeat  -loss of memory  -low blood counts - this medicine may decrease the number of white blood cells, red blood cells and platelets. You may be at increased risk for infections and bleeding.  -mouth sores  -problems with balance, talking, or walking  -redness, blistering, peeling or loosening of the skin, including inside the mouth  -signs of infection - fever or chills, cough, sore throat, pain or difficulty passing urine  -signs and symptoms of kidney injury like trouble passing urine or change in the amount of urine  -signs and symptoms of liver injury like dark yellow or brown urine; general ill feeling or flu-like symptoms; light-colored stools; loss of appetite; nausea; right upper belly pain; unusually weak or tired; yellowing of the eyes or skin  -signs and symptoms of low blood pressure like dizziness; feeling faint or lightheaded, falls; unusually weak or tired  -stomach pain  -swelling of the ankles, feet, hands  -unusual bleeding or bruising  -vomiting  Side effects that usually do not require medical attention (report to your doctor or health care professional if they continue or are bothersome):  -headache  -joint pain  -muscle cramps or muscle pain  -nausea  -tiredness  This list may not describe all possible side effects. Call your doctor for medical advice about side effects. You may report side effects to FDA at 1-800-FDA-1088.  Where should I keep my medicine?  This drug is given in a hospital or clinic and will not be stored at home.  NOTE: This sheet is a summary. It may not cover all possible information. If you have questions about this medicine, talk to your doctor, pharmacist, or health care provider.   2019 Elsevier/Gold Standard (2017-10-16 13:04:32)

## 2019-02-07 NOTE — Progress Notes (Signed)
Pt here for follow up. Previous Dr. Janese Banks patient. Denies any concerns at this time.

## 2019-02-08 ENCOUNTER — Encounter: Payer: Self-pay | Admitting: Hematology and Oncology

## 2019-02-08 LAB — HEPATITIS C ANTIBODY: HCV Ab: <0.1 s/co ratio (ref 0.0–0.9)

## 2019-02-08 LAB — HEPATITIS B SURFACE ANTIGEN: Hepatitis B Surface Ag: NEGATIVE

## 2019-02-08 LAB — HEPATITIS B CORE ANTIBODY, TOTAL: Hep B Core Total Ab: NEGATIVE

## 2019-03-18 ENCOUNTER — Encounter: Payer: Self-pay | Admitting: Internal Medicine

## 2019-06-07 IMAGING — CT CT TEMPORAL BONES W/O CM
3 of 6 series · 15 of 40 positions shown, 18 images · non-contrast
Comparison: None.

CLINICAL DATA: Vasculitis. Abnormal hearing. Sensation of fullness
on the right, 3 weeks duration. History of skin cancer.

EXAM:
CT TEMPORAL BONES WITHOUT CONTRAST
TECHNIQUE: Axial and coronal plane CT imaging of the petrous temporal bones was
performed with thin-collimation image reconstruction. No intravenous
contrast was administered. Multiplanar CT image reconstructions were
also generated.

[Series 3: ax soft temperal bones · axial · 0.33mm/px · z∈[-514,-494]mm · 2 of 31 slices shown]
[im 11/31  brain]
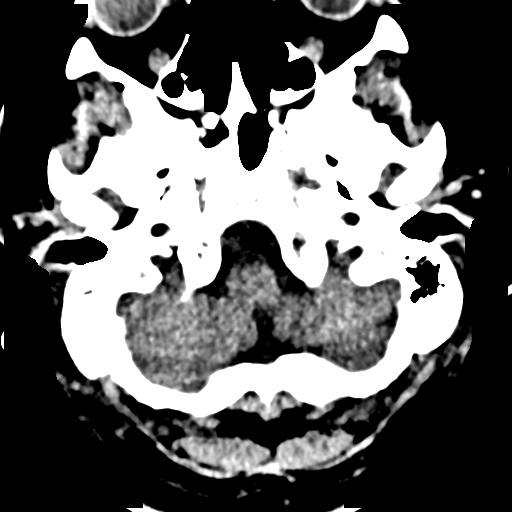
[im 21/31  brain]
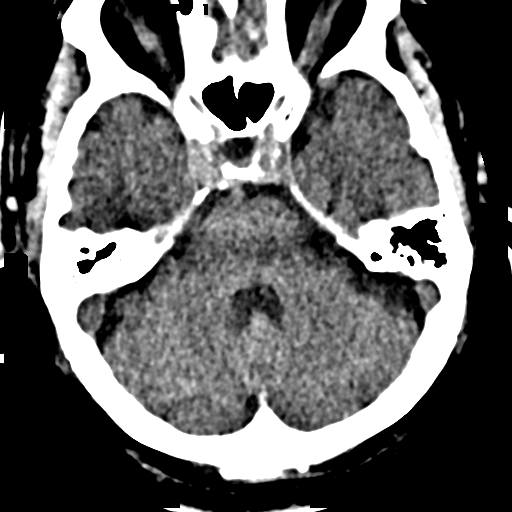

[Series 8: ax mag right temperal bones · axial · 0.20mm/px · z∈[-530,-478]mm · 11 of 104 slices shown, 14 images]
[im 9/104  brain]
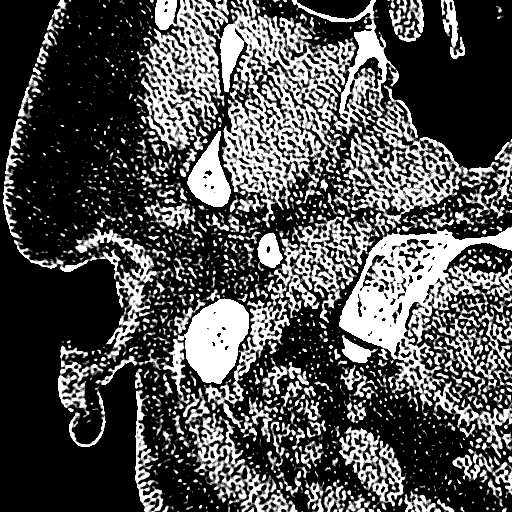
[im 9/104  bone]
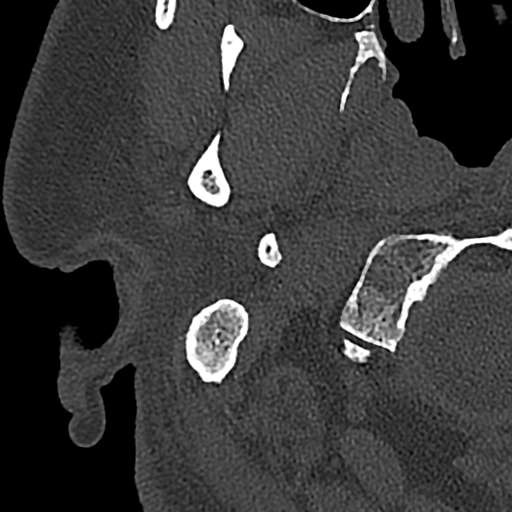
[im 18/104  bone]
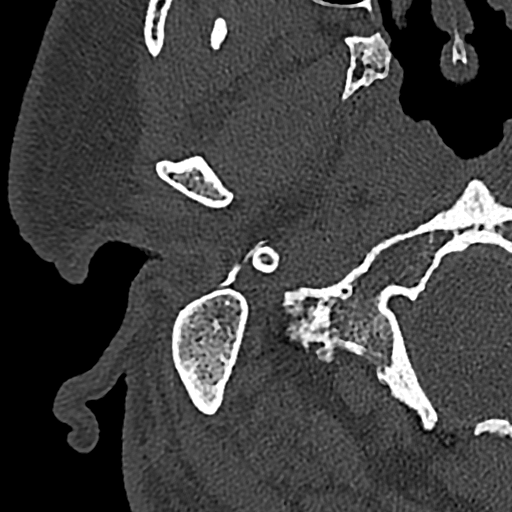
[im 26/104  bone]
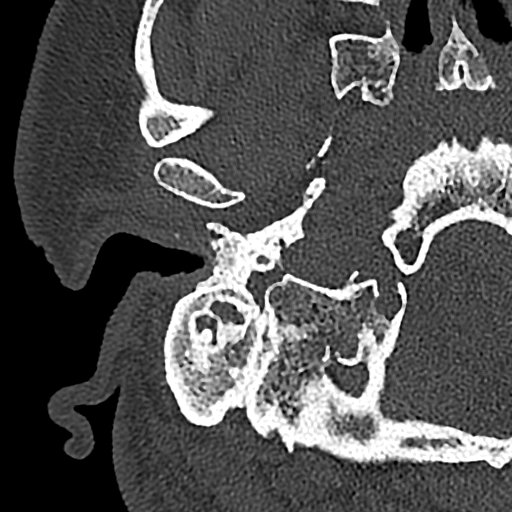
[im 35/104  bone]
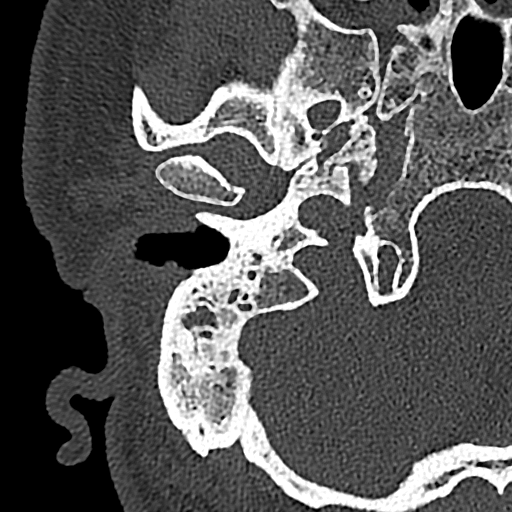
[im 43/104  brain]
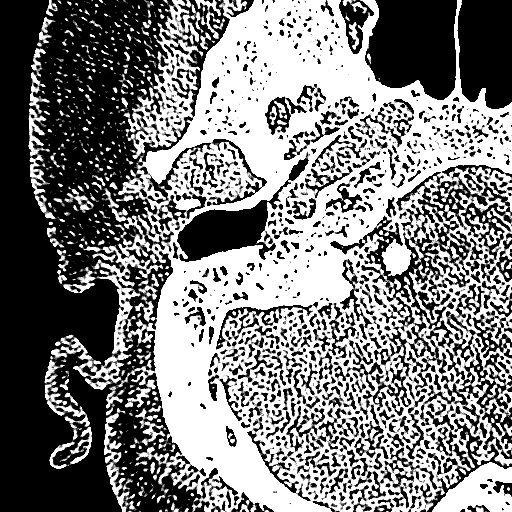
[im 43/104  bone]
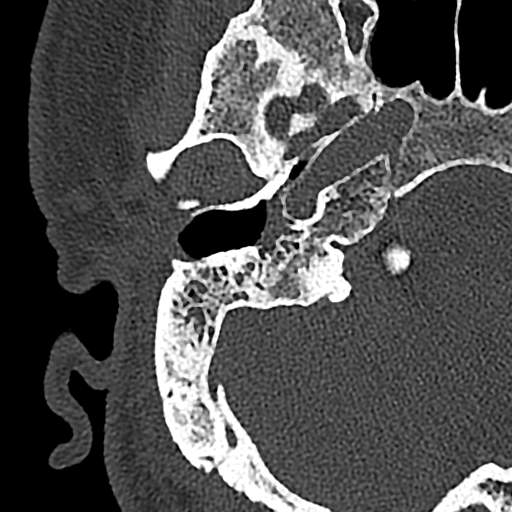
[im 52/104  bone]
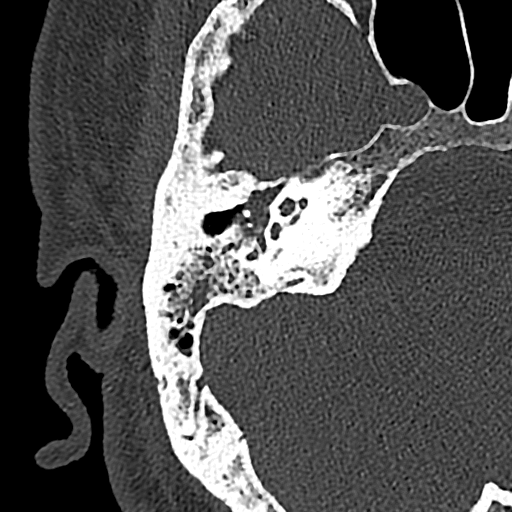
[im 61/104  bone]
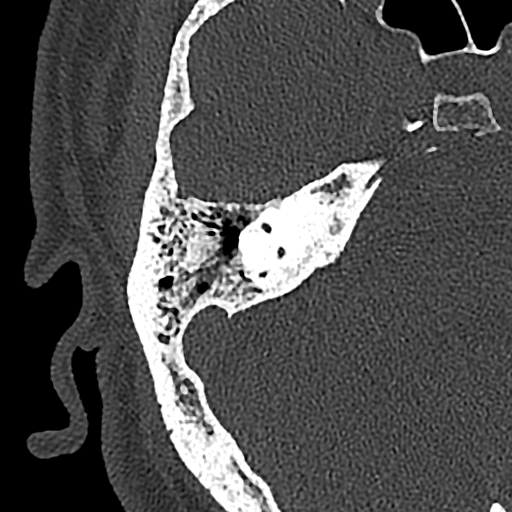
[im 69/104  bone]
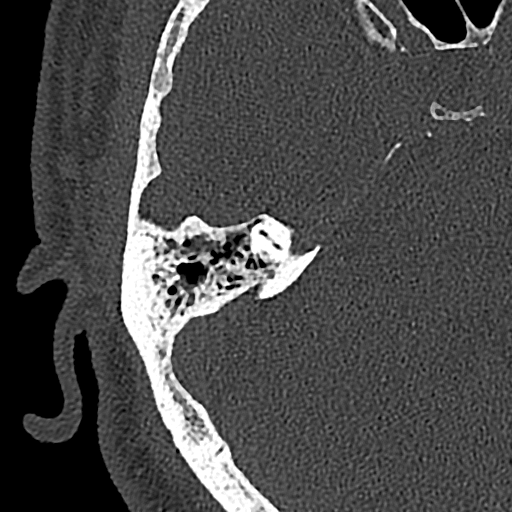
[im 78/104  brain]
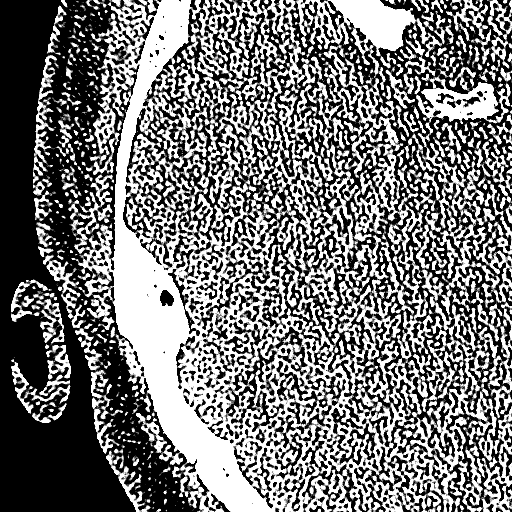
[im 78/104  bone]
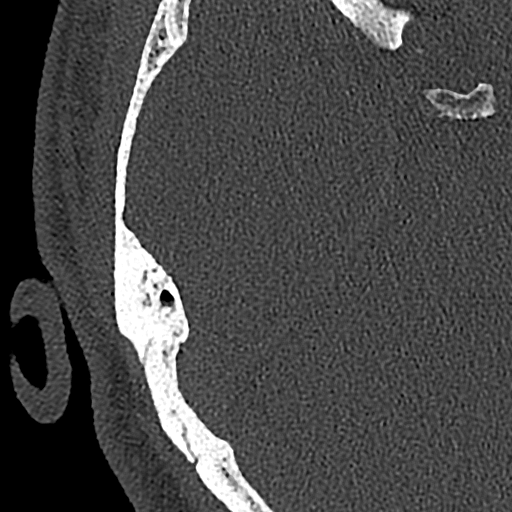
[im 86/104  bone]
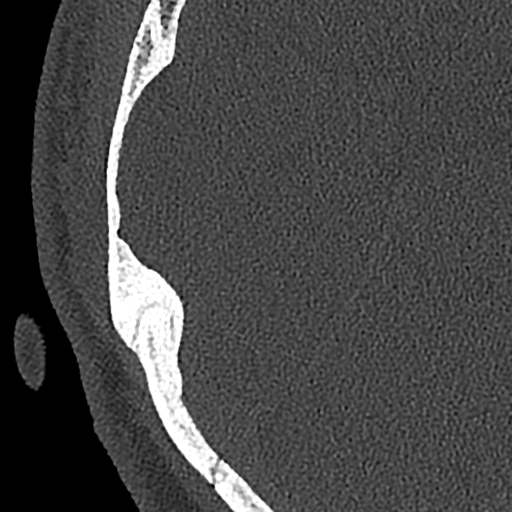
[im 95/104  bone]
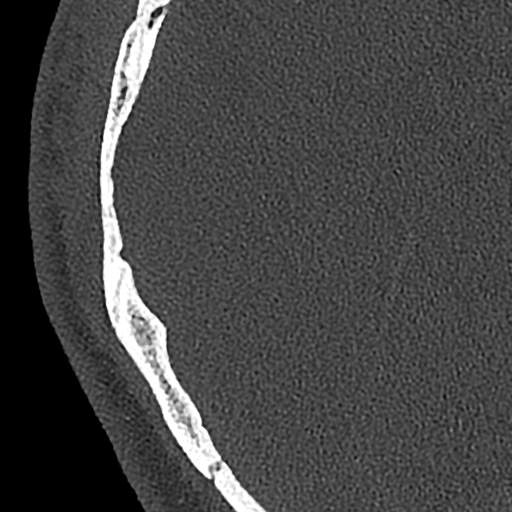

[Series 10: cor mag right temperal bones · coronal · 0.12mm/px · 2 of 125 slices shown]
[im 42/125  bone]
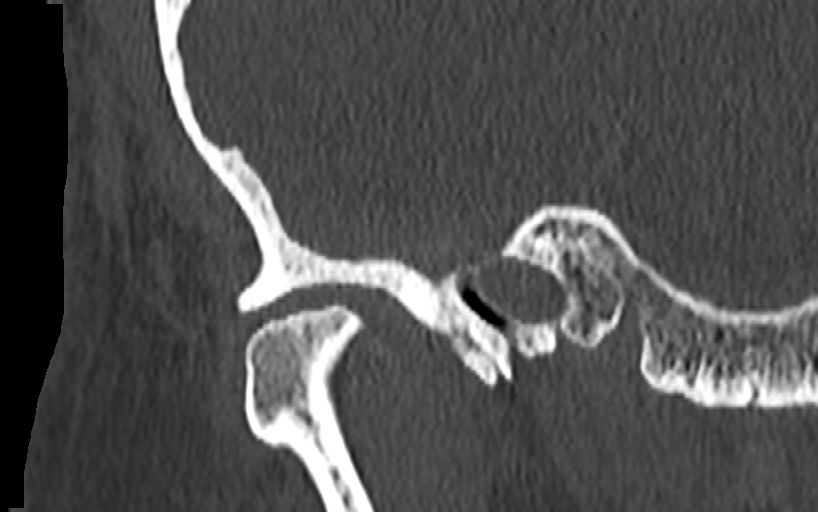
[im 83/125  bone]
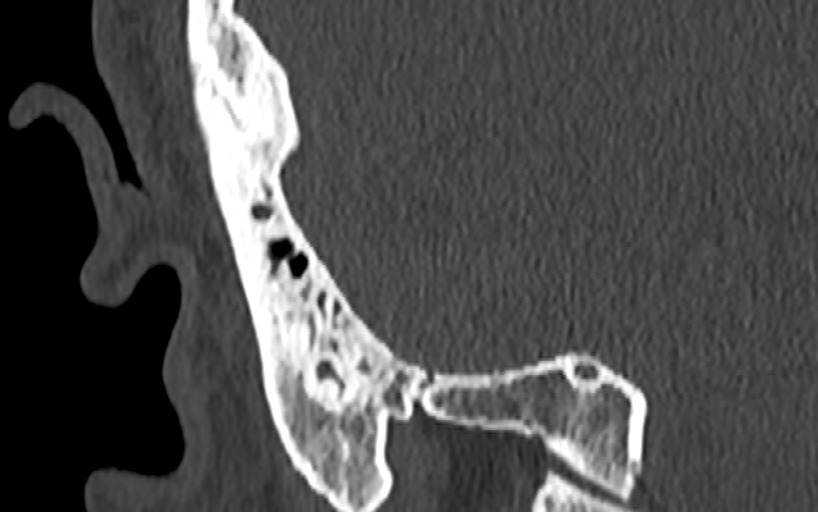

[15 of 40 positions shown; findings below may reference images not displayed]

FINDINGS: Left temporal bone: Mastoid air cells normally aerated. External
auditory canal widely patent. Middle ear is clear. Ossicles are
normal. Attic is clear. Inner ear structures are normal.

Right temporal bone: Opacification of the majority of the mastoid
air cells. No evidence of destructive change or coalescence.
External auditory canal widely patent. Middle ear is full of fluid.
Ossicular chain remains intact. Small amount of fluid in the attic.
Inner ear structures appear normal.
IMPRESSION: Subtotal opacification of the right mastoid air cells. Extensive
opacification of the middle ear. Subtotal opacification of the
attic. Findings consistent with otitis media and mastoiditis. No
evidence bone destruction or coalescence.

## 2019-08-05 ENCOUNTER — Other Ambulatory Visit: Payer: Self-pay | Admitting: Hematology and Oncology

## 2019-08-07 ENCOUNTER — Other Ambulatory Visit: Payer: Self-pay

## 2019-08-07 ENCOUNTER — Encounter: Payer: Self-pay | Admitting: Nurse Practitioner

## 2019-08-07 NOTE — Progress Notes (Signed)
Patient stated that he had been doing well, however, he continues to have pain on his toes and some tingling.

## 2019-08-08 ENCOUNTER — Encounter: Payer: Self-pay | Admitting: Nurse Practitioner

## 2019-08-08 ENCOUNTER — Inpatient Hospital Stay: Payer: Medicare Other | Attending: Hematology and Oncology | Admitting: Nurse Practitioner

## 2019-08-08 ENCOUNTER — Inpatient Hospital Stay: Payer: Medicare Other

## 2019-08-08 VITALS — BP 165/86 | HR 64 | Temp 97.1°F | Resp 18 | Wt 249.1 lb

## 2019-08-08 VITALS — BP 166/89 | HR 60 | Temp 97.0°F | Resp 16

## 2019-08-08 DIAGNOSIS — Z7982 Long term (current) use of aspirin: Secondary | ICD-10-CM | POA: Diagnosis not present

## 2019-08-08 DIAGNOSIS — Z79899 Other long term (current) drug therapy: Secondary | ICD-10-CM | POA: Insufficient documentation

## 2019-08-08 DIAGNOSIS — Z85828 Personal history of other malignant neoplasm of skin: Secondary | ICD-10-CM | POA: Diagnosis not present

## 2019-08-08 DIAGNOSIS — Z5112 Encounter for antineoplastic immunotherapy: Secondary | ICD-10-CM | POA: Diagnosis not present

## 2019-08-08 DIAGNOSIS — M3131 Wegener's granulomatosis with renal involvement: Secondary | ICD-10-CM

## 2019-08-08 DIAGNOSIS — I10 Essential (primary) hypertension: Secondary | ICD-10-CM | POA: Insufficient documentation

## 2019-08-08 DIAGNOSIS — M3130 Wegener's granulomatosis without renal involvement: Secondary | ICD-10-CM | POA: Diagnosis present

## 2019-08-08 DIAGNOSIS — E079 Disorder of thyroid, unspecified: Secondary | ICD-10-CM | POA: Diagnosis not present

## 2019-08-08 DIAGNOSIS — G629 Polyneuropathy, unspecified: Secondary | ICD-10-CM | POA: Insufficient documentation

## 2019-08-08 LAB — CBC WITH DIFFERENTIAL/PLATELET
Abs Immature Granulocytes: 0.02 10*3/uL (ref 0.00–0.07)
Basophils Absolute: 0.1 10*3/uL (ref 0.0–0.1)
Basophils Relative: 1 %
Eosinophils Absolute: 0.1 10*3/uL (ref 0.0–0.5)
Eosinophils Relative: 2 %
HCT: 44.3 % (ref 39.0–52.0)
Hemoglobin: 15.2 g/dL (ref 13.0–17.0)
Immature Granulocytes: 0 %
Lymphocytes Relative: 17 %
Lymphs Abs: 0.9 10*3/uL (ref 0.7–4.0)
MCH: 30.9 pg (ref 26.0–34.0)
MCHC: 34.3 g/dL (ref 30.0–36.0)
MCV: 90 fL (ref 80.0–100.0)
Monocytes Absolute: 0.7 10*3/uL (ref 0.1–1.0)
Monocytes Relative: 13 %
Neutro Abs: 3.7 10*3/uL (ref 1.7–7.7)
Neutrophils Relative %: 67 %
Platelets: 159 10*3/uL (ref 150–400)
RBC: 4.92 MIL/uL (ref 4.22–5.81)
RDW: 13.6 % (ref 11.5–15.5)
WBC: 5.6 10*3/uL (ref 4.0–10.5)
nRBC: 0 % (ref 0.0–0.2)

## 2019-08-08 LAB — COMPREHENSIVE METABOLIC PANEL
ALT: 29 U/L (ref 0–44)
AST: 24 U/L (ref 15–41)
Albumin: 4 g/dL (ref 3.5–5.0)
Alkaline Phosphatase: 55 U/L (ref 38–126)
Anion gap: 7 (ref 5–15)
BUN: 18 mg/dL (ref 8–23)
CO2: 29 mmol/L (ref 22–32)
Calcium: 9.2 mg/dL (ref 8.9–10.3)
Chloride: 100 mmol/L (ref 98–111)
Creatinine, Ser: 1.14 mg/dL (ref 0.61–1.24)
GFR calc Af Amer: >60 mL/min (ref >60)
GFR calc non Af Amer: >60 mL/min (ref >60)
Glucose, Bld: 104 mg/dL — ABNORMAL HIGH (ref 70–99)
Potassium: 3.6 mmol/L (ref 3.5–5.1)
Sodium: 136 mmol/L (ref 135–145)
Total Bilirubin: 0.8 mg/dL (ref 0.3–1.2)
Total Protein: 7.1 g/dL (ref 6.5–8.1)

## 2019-08-08 MED ORDER — SODIUM CHLORIDE 0.9 % IV SOLN
1000.0000 mg | Freq: Once | INTRAVENOUS | Status: AC
  Administered 2019-08-08: 1000 mg via INTRAVENOUS
  Filled 2019-08-08: qty 100

## 2019-08-08 MED ORDER — DIPHENHYDRAMINE HCL 25 MG PO CAPS
50.0000 mg | ORAL_CAPSULE | Freq: Once | ORAL | Status: AC
  Administered 2019-08-08: 50 mg via ORAL
  Filled 2019-08-08: qty 2

## 2019-08-08 MED ORDER — SODIUM CHLORIDE 0.9 % IV SOLN
Freq: Once | INTRAVENOUS | Status: AC
  Administered 2019-08-08: 10:00:00 via INTRAVENOUS
  Filled 2019-08-08: qty 250

## 2019-08-08 MED ORDER — ACETAMINOPHEN 325 MG PO TABS
650.0000 mg | ORAL_TABLET | Freq: Once | ORAL | Status: AC
  Administered 2019-08-08: 650 mg via ORAL
  Filled 2019-08-08: qty 2

## 2019-08-08 MED ORDER — SODIUM CHLORIDE 0.9 % IV SOLN: 1000.0000 mg | Freq: Once | INTRAVENOUS | Status: DC

## 2019-08-08 MED ORDER — METHYLPREDNISOLONE SODIUM SUCC 125 MG IJ SOLR
100.0000 mg | Freq: Once | INTRAMUSCULAR | Status: AC
  Administered 2019-08-08: 10:00:00 100 mg via INTRAVENOUS
  Filled 2019-08-08: qty 2

## 2019-08-08 NOTE — Patient Instructions (Signed)
Rituximab injection What is this medicine? RITUXIMAB (ri TUX i mab) is a monoclonal antibody. It is used to treat certain types of cancer like non-Hodgkin lymphoma and chronic lymphocytic leukemia. It is also used to treat rheumatoid arthritis, granulomatosis with polyangiitis (or Wegener's granulomatosis), microscopic polyangiitis, and pemphigus vulgaris. This medicine may be used for other purposes; ask your health care provider or pharmacist if you have questions. COMMON BRAND NAME(S): Rituxan, RUXIENCE What should I tell my health care provider before I take this medicine? They need to know if you have any of these conditions:  heart disease  infection (especially a virus infection such as hepatitis B, chickenpox, cold sores, or herpes)  immune system problems  irregular heartbeat  kidney disease  low blood counts, like low white cell, platelet, or red cell counts  lung or breathing disease, like asthma  recently received or scheduled to receive a vaccine  an unusual or allergic reaction to rituximab, other medicines, foods, dyes, or preservatives  pregnant or trying to get pregnant  breast-feeding How should I use this medicine? This medicine is for infusion into a vein. It is administered in a hospital or clinic by a specially trained health care professional. A special MedGuide will be given to you by the pharmacist with each prescription and refill. Be sure to read this information carefully each time. Talk to your pediatrician regarding the use of this medicine in children. This medicine is not approved for use in children. Overdosage: If you think you have taken too much of this medicine contact a poison control center or emergency room at once. NOTE: This medicine is only for you. Do not share this medicine with others. What if I miss a dose? It is important not to miss a dose. Call your doctor or health care professional if you are unable to keep an appointment. What  may interact with this medicine?  cisplatin  live virus vaccines This list may not describe all possible interactions. Give your health care provider a list of all the medicines, herbs, non-prescription drugs, or dietary supplements you use. Also tell them if you smoke, drink alcohol, or use illegal drugs. Some items may interact with your medicine. What should I watch for while using this medicine? Your condition will be monitored carefully while you are receiving this medicine. You may need blood work done while you are taking this medicine. This medicine can cause serious allergic reactions. To reduce your risk you may need to take medicine before treatment with this medicine. Take your medicine as directed. In some patients, this medicine may cause a serious brain infection that may cause death. If you have any problems seeing, thinking, speaking, walking, or standing, tell your healthcare professional right away. If you cannot reach your healthcare professional, urgently seek other source of medical care. Call your doctor or health care professional for advice if you get a fever, chills or sore throat, or other symptoms of a cold or flu. Do not treat yourself. This drug decreases your body's ability to fight infections. Try to avoid being around people who are sick. Do not become pregnant while taking this medicine or for at least 12 months after stopping it. Women should inform their doctor if they wish to become pregnant or think they might be pregnant. There is a potential for serious side effects to an unborn child. Talk to your health care professional or pharmacist for more information. Do not breast-feed an infant while taking this medicine or for at   least 6 months after stopping it. What side effects may I notice from receiving this medicine? Side effects that you should report to your doctor or health care professional as soon as possible:  allergic reactions like skin rash, itching or  hives; swelling of the face, lips, or tongue  breathing problems  chest pain  changes in vision  diarrhea  headache with fever, neck stiffness, sensitivity to light, nausea, or confusion  fast, irregular heartbeat  loss of memory  low blood counts - this medicine may decrease the number of white blood cells, red blood cells and platelets. You may be at increased risk for infections and bleeding.  mouth sores  problems with balance, talking, or walking  redness, blistering, peeling or loosening of the skin, including inside the mouth  signs of infection - fever or chills, cough, sore throat, pain or difficulty passing urine  signs and symptoms of kidney injury like trouble passing urine or change in the amount of urine  signs and symptoms of liver injury like dark yellow or brown urine; general ill feeling or flu-like symptoms; light-colored stools; loss of appetite; nausea; right upper belly pain; unusually weak or tired; yellowing of the eyes or skin  signs and symptoms of low blood pressure like dizziness; feeling faint or lightheaded, falls; unusually weak or tired  stomach pain  swelling of the ankles, feet, hands  unusual bleeding or bruising  vomiting Side effects that usually do not require medical attention (report to your doctor or health care professional if they continue or are bothersome):  headache  joint pain  muscle cramps or muscle pain  nausea  tiredness This list may not describe all possible side effects. Call your doctor for medical advice about side effects. You may report side effects to FDA at 1-800-FDA-1088. Where should I keep my medicine? This drug is given in a hospital or clinic and will not be stored at home. NOTE: This sheet is a summary. It may not cover all possible information. If you have questions about this medicine, talk to your doctor, pharmacist, or health care provider.  2020 Elsevier/Gold Standard (2018-12-15  22:01:36)  

## 2019-08-08 NOTE — Patient Instructions (Signed)
As we discussed, you may notice some improvement in your symptoms with acupuncture. Below is the names of some local clinics that we frequently work with and they may offer you a discounted rate. You can contact them for an appointment at your convenience.  - [facility] [place] [facility]) or [phone] [facility])  Please also contact your neurologist for discussion of [facility]ler therapy to determine if these modalities may be helpful to you. It was a pleasure meeting you today and thank you for allowing me to participate in your care. -Beckey Rutter, NP

## 2019-08-08 NOTE — Progress Notes (Signed)
[facility] [address] [place], [place] [address] Phone: [phone]  Fax: [phone]    Clinic day:  08/08/2019  Chief Complaint: Logan Murphy is a 73 y.o. 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  HPI:   The patient was diagnosed with Wegener's granulomatosis in 12/2017. He noted "total exhaustion" x 4 months and recurrent multilobar pneumonia.  He had hematuria.  He developed renal syndrome in 08/2017. He presented with a chronic dry cough, persistent dyspnea, hematuria, fatigue,  and a  petechial vasculitic rash on his lower extremities. CT scan revealed groundglass opacities in his lungs.  Bronchoscopy on 01/13/2018 was nondiagnostic. 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 Patient continues to be followed monthly at [facility].   He underwent renal biopsy on 02/09/2018.  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  There was moderate arteriosclerosis with mild interstitial fibrosis and tubular atrophy.   He was started on prednsione 60 mg/day.  After starting steroids, his cough, shortness of breath, and rash resolved. His biggest complaint was then fatigue. His exercise capacity diminished.  He was evaluated by Dr. Annalee Genta, rheumatology, who recommended induction treatment with Rituxan 1 dose x  at 14-day interval.  He received Rituxan 1000 mg x 2 (02/22/2018 and 03/08/2018) with significant improvement in symptoms.  Patient was last seen by Dr. Mike Gip 02/07/2019. At that time his fatigue had improved. He had persistent neuropathy and had been seen at [facility] and is s/p EMG testing which demonstrated 'slowed conduction'. Leg swelling has resolved. He received Rituxan 1000 mg on 02/07/2019; dosing per rheumatology.   CBC on 02/07/2019 included hematocrit 42.1, hemoglobin 14.3, platelets 204,000, wbc 5.5, ANC 3.7. Metabolic  panel included creatnine 0.94. LFTs normal. Heb B antigen negative, heb b antibody negative, hep c antibody < 0.1 - negative.   Today he says that he continues to feel well and is becoming increasingly active and hopes to return to his level of activity that he had prior to diagnosis. He was an active golfer and played pickle ball. He continues to be bothered by neuropathic symptoms in feet and hands. He was not able to tolerate cymbalta and is weaning off of it. He continues to be followed by neurology at [facility] and rheumatology. Leg swelling has resolved. He denies cough, fever, chills, or other interval B symptoms. Denies interval infections. No headaches or visual changes. No history of transfusion reaction and says he has tolerated rituxan well. No gross hematuria. No rash or skin changes. Appetite is good. Weight is stable. No pain today.    Past Medical History:  Diagnosis Date  . 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 Chronic pain   . Gout   . Gout   . Hypertension   . Skin cancer   . Thyroid disease     Past Surgical History:  Procedure Laterality Date  . CHOLECYSTECTOMY    . GALLBLADDER SURGERY    . TOTAL HIP ARTHROPLASTY Right   . VIDEO BRONCHOSCOPY Bilateral 01/13/2018   Procedure: VIDEO BRONCHOSCOPY WITH FLUORO;  Surgeon: [name], MD;  Location: [facility] ORS;  Service: Cardiopulmonary;  Laterality: Bilateral;    Family History  Problem Relation Age of Onset  . Hypertension Other     Social History:  reports that he has never smoked. He has never used smokeless tobacco. He reports current alcohol use. He reports that he does not use  drugs.  He is retired from [employer].  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  He lives in [place].  The patient is alone today.  Allergies:  Allergies  Allergen Reactions  . Bee Venom Anaphylaxis    Red fire ants  . Fire Dynegy Anaphylaxis    Current Medications: Current Outpatient  Medications  Medication Sig Dispense Refill  . allopurinol (ZYLOPRIM) 300 MG tablet Take 300 mg by mouth daily.    . Alpha-Lipoic Acid 100 MG TABS Take 1 tablet by mouth 1 day or 1 dose.    Marland Kitchen aspirin EC 81 MG tablet Take 81 mg by mouth daily.    . calcium carbonate (OS-CAL) 600 MG tablet Take 600 mg by mouth daily.     . Cholecalciferol 400 units CAPS Take 400 Units by mouth daily.     Marland Kitchen co-enzyme Q-10 30 MG capsule Take 30 mg by mouth daily.    . DULoxetine (CYMBALTA) 30 MG capsule Take 1 capsule by mouth daily.    Marland Kitchen EPINEPHrine 0.3 mg/0.3 mL IJ SOAJ injection Inject 0.3 mg into the muscle as needed for anaphylaxis.    . furosemide (LASIX) 20 MG tablet Take 20 mg by mouth every other day.     . hydrochlorothiazide (MICROZIDE) 12.5 MG capsule Take 12.5 mg by mouth daily.    Marland Kitchen levothyroxine (SYNTHROID, LEVOTHROID) 88 MCG tablet Take 88 mcg by mouth daily before breakfast.     . lisinopril (PRINIVIL,ZESTRIL) 40 MG tablet Take 40 mg by mouth daily.    . NON FORMULARY Take 1 tablet by mouth 1 day or 1 dose. COSMO-[employer] study medication    . pravastatin (PRAVACHOL) 20 MG tablet Take 20 mg by mouth daily.    . tamsulosin (FLOMAX) 0.4 MG CAPS capsule Take 0.4 mg by mouth daily.     No current facility-administered medications for this visit.     Review of Systems  Constitutional: Negative for chills, fever, malaise/fatigue and weight loss.  HENT: Negative for congestion, ear discharge, ear pain, nosebleeds, sinus pain and sore throat.   Eyes: Negative for blurred vision, double vision, photophobia and redness.  Respiratory: Negative for cough, hemoptysis, sputum production, shortness of breath and wheezing.   Cardiovascular: Negative for chest pain, palpitations, orthopnea, claudication and leg swelling.  Gastrointestinal: Negative for abdominal pain, blood in stool, constipation, diarrhea, melena and nausea.  Genitourinary: Negative for dysuria, frequency, hematuria and urgency.   Musculoskeletal: Negative for back pain, falls, joint pain and myalgias.  Skin: Negative for itching and rash.  Neurological: Positive for sensory change (per hpi). Negative for dizziness, focal weakness, seizures, weakness and headaches.  Endo/Heme/Allergies: Negative for environmental allergies. Does not bruise/bleed easily.  Psychiatric/Behavioral: Negative for depression. The patient is not nervous/anxious.     Physical Exam: Blood pressure (!) 165/86, pulse 64, temperature (!) 97.1 F (36.2 C), temperature source Tympanic, resp. rate 18, weight 249 lb 1.9 oz (113 kg), SpO2 97 %. Physical Exam  Constitutional: He is oriented to person, place, and time. He appears well-developed and well-nourished.  In exam room; wearing mask. unaccompanied  HENT:  Head: Normocephalic and atraumatic.  Mouth/Throat: Oropharynx is clear and moist.  Eyes: Pupils are equal, round, and reactive to light. Conjunctivae and EOM are normal. No scleral icterus.  Neck: Normal range of motion. Neck supple.  Cardiovascular: Normal rate, regular rhythm and normal heart sounds.  Pulmonary/Chest: Effort normal and breath sounds normal.  Abdominal: Soft. Bowel sounds are normal. He exhibits no distension.  There is no abdominal tenderness.  Musculoskeletal:        General: No deformity or edema.     Comments: Ambulates w/o aids  Lymphadenopathy:    He has no cervical adenopathy.  Neurological: He is alert and oriented to person, place, and time.  Skin: Skin is warm and dry.  Psychiatric: He has a normal mood and affect. His behavior is normal.    Appointment on 08/08/2019  Component Date Value Ref Range Status  . Sodium 08/08/2019 136  135 - 145 mmol/L Final  . Potassium 08/08/2019 3.6  3.5 - 5.1 mmol/L Final  . Chloride 08/08/2019 100  98 - 111 mmol/L Final  . CO2 08/08/2019 29  22 - 32 mmol/L Final  . Glucose, Bld 08/08/2019 104* 70 - 99 mg/dL Final  . BUN 08/08/2019 18  8 - 23 mg/dL Final  . Creatinine,  Ser 08/08/2019 1.14  0.61 - 1.24 mg/dL Final  . Calcium 08/08/2019 9.2  8.9 - 10.3 mg/dL Final  . Total Protein 08/08/2019 7.1  6.5 - 8.1 g/dL Final  . Albumin 08/08/2019 4.0  3.5 - 5.0 g/dL Final  . AST 08/08/2019 24  15 - 41 U/L Final  . ALT 08/08/2019 29  0 - 44 U/L Final  . Alkaline Phosphatase 08/08/2019 55  38 - 126 U/L Final  . Total Bilirubin 08/08/2019 0.8  0.3 - 1.2 mg/dL Final  . GFR calc non Af Amer 08/08/2019 >60  >60 mL/min Final  . GFR calc Af Amer 08/08/2019 >60  >60 mL/min Final  . Anion gap 08/08/2019 7  5 - 15 Final   Performed at [facility], [address], [place], [place] [address]  . WBC 08/08/2019 5.6  4.0 - 10.5 K/uL Final  . RBC 08/08/2019 4.92  4.22 - 5.81 MIL/uL Final  . Hemoglobin 08/08/2019 15.2  13.0 - 17.0 g/dL Final  . HCT 08/08/2019 44.3  39.0 - 52.0 % Final  . MCV 08/08/2019 90.0  80.0 - 100.0 fL Final  . MCH 08/08/2019 30.9  26.0 - 34.0 pg Final  . MCHC 08/08/2019 34.3  30.0 - 36.0 g/dL Final  . RDW 08/08/2019 13.6  11.5 - 15.5 % Final  . Platelets 08/08/2019 159  150 - 400 K/uL Final  . nRBC 08/08/2019 0.0  0.0 - 0.2 % Final  . Neutrophils Relative % 08/08/2019 67  % Final  . Neutro Abs 08/08/2019 3.7  1.7 - 7.7 K/uL Final  . Lymphocytes Relative 08/08/2019 17  % Final  . Lymphs Abs 08/08/2019 0.9  0.7 - 4.0 K/uL Final  . Monocytes Relative 08/08/2019 13  % Final  . Monocytes Absolute 08/08/2019 0.7  0.1 - 1.0 K/uL Final  . Eosinophils Relative 08/08/2019 2  % Final  . Eosinophils Absolute 08/08/2019 0.1  0.0 - 0.5 K/uL Final  . Basophils Relative 08/08/2019 1  % Final  . Basophils Absolute 08/08/2019 0.1  0.0 - 0.1 K/uL Final  . Immature Granulocytes 08/08/2019 0  % Final  . Abs Immature Granulocytes 08/08/2019 0.02  0.00 - 0.07 K/uL Final   Performed at [facility], [address], [place], [place] [address]    Assessment:  Logan Murphy is a 73 y.o. male with Wegner's granulomatosis diagnosed in 12/2017. He  developed renal syndrome in 08/2017. He presented with a chronic dry cough, persistent dyspnea and a  petechial vasculitic rash on his lower extremities. CT scan revealed groundglass opacities in his lungs.  Bronchoscopy  was nondiagnostic. 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  There was moderate arteriosclerosis with mild interstitial fibrosis and tubular atrophy.   He was started on prednisone 60 mg a day.  Hepatitis B testing was negative on 12/31/2017.  He received Rituxan 1000 mg x 2 (02/22/2018 and 03/08/2018) with significant improvement in symptoms.  He last received Rituxan on 09/23/20219.  Symptomatically, patient doing well without evidence of B symptoms or recent infections. Continues to have peripheral neuropathy, worse in feet intolerant to cymbalta. Has tolerated rituxan well without significant side effects. No evidence of hepatitis reactivation at last lab check, no skin reactions. Labs today look stable. Exam is grossly unremarkable.   Plan: 1. Labs today:  Cbc w/ diff, cmp were reviewed and discuss with patient.  2. Wegener's granulomatosis  Again discussed diagnosis and management of Wegener's granulomatosis   Discussed rituxan and mechanism of action as well as risks and possible side effects including possible reaction, less with subsequent treatments, reactivation of hepatitis b, infection, progressive multifocal leukoencephalopathy (PML), leukopenia  Discussed prior hepatitis testing and need to recheck annually  Treatment directed by rheumatology  Reinforced role of infusion center  Patient verbalizes understanding and wishes to proceed with treatment today.   Rituxan 1000 mg IV today. Dose confirmed with rheumatology and rheumatology's last note reviewed  Premeds: benadryl 50 mg PO, tylenol 650 mg PO, solumedrol 100 mg IV  3.   RTC in 6 months for MD assessment, labs (CBC with diff, CMP, hepatitis labs), and Rituxan.  Beckey Rutter, DNP, AGNP-C [facility] [place] (work cell) [phone] (office)  CC: Dr. Mike Gip

## 2020-02-04 NOTE — Progress Notes (Signed)
[facility]  [address] [place], [place] [address] Phone: [phone]  Fax: [phone]   Clinic Day:  02/06/2020  Referring physician: Tracie Harrier, MD  Chief Complaint: Logan Murphy is a 74 y.o. 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  HPI: The patient was last seen in the medical oncology clinic on 02/07/2019 as a new patient. At that time, patient was doing well. He denied B symptoms and recent infections. He complained of neuropathy in his feet. He was being followed by neurology at [facility].  He was eating well; no weight loss. Exam was grossly unremarkable. He received Rituxan 1,000 mg IV.   Labs included hematocrit 42.1, hemoglobin 14.3, platelets 204,000, WBC 5,500. CMP was normal. Hepatitis B surface antigen and hepatitis B core total anitbody were negative. Hepatitis C antibody was <0.1.   Head MRI on 03/05/2019 at [facility] showed no acute intracranial abnormality. There was small vessel ischemic disease. There was large right mastoid effusion.   He was seen by neurologist, Dr. Kerby Less, on 04/14/2019. Patient noted tingling pain in his bilateral feet and hands following treatment with rituximab. He described worsening burning sensation and pain for 4-5 days from his toes toward the ball of his foot. Patient continued current medication. Patient would follow up in 6 months.   Patient was last seen in the medical oncology clinic on 08/08/2019 by Beckey Rutter, NP.  At that time, patient was doing well without evidence of B symptoms or recent infections.  He continued to have peripheral neuropathy, worse in feet and intolerant to Cymbalta. Had tolerated Rituxan well without significant side effects. He had no evidence of hepatitis reactivation at last lab check and no skin reactions. Exam was grossly unremarkable. Hematocrit was 44.3, hemoglobin 15.2, platelets 159,000, WBC 5,600. CMP  was normal. He received Rituxan 1,000 mg IV on 08/08/2019.  Colonoscopy at [facility] on 11/04/2019 (no report available).   He received his second COVID-19 vaccine on 12/22/2019.  Patient was seen in rheumatologist, Dr. Annalee Genta, on 12/27/2019. He was doing well. He noted neuropathic pain including toes, ball of the feet and fingers. He reported exercising daily. Patient had no other complaints. Dr. Annalee Genta dicussed Lyrica since he did not tolerate gabapentin or Cymbalta.  Plan was to continue Rituxan every 6 months.  Per new ACR guidelines on COVID-19 vaccine and patient rheumatic and musculoskeletal disease, from 12/26/2019, after the second vaccine delay Rituxan 2-4 weeks after the second dose if disease activity allows.  Patient will follow up on 06/25/2020.   During the interim, he has felt "fine". He notes muscles pain and fatigue. He has neuropathy in his feet. He has occasional neuropathic pain in his fingers and heels of his feet. His notes relief with acupuncture. He may go back to acupuncture after treatment today. His is active and goes to the pool daily.   His weight is down 4 pounds. He reports being on an asserted diet.  Reports a seasonal cough. He denies any fever, chills or sweats. He denied any visual changes or headaches.    Past Medical History:  Diagnosis Date  . 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 Chronic pain   . Gout   . Gout   . Hypertension   . Skin cancer   . Thyroid disease     Past Surgical History:  Procedure Laterality Date  . CHOLECYSTECTOMY    .  GALLBLADDER SURGERY    . TOTAL HIP ARTHROPLASTY Right   . VIDEO BRONCHOSCOPY Bilateral 01/13/2018   Procedure: VIDEO BRONCHOSCOPY WITH FLUORO;  Surgeon: [name], MD;  Location: [facility] ORS;  Service: Cardiopulmonary;  Laterality: Bilateral;    Family History  Problem Relation Age of Onset  . Hypertension Other     Social History:  reports that he has never  smoked. He has never used smokeless tobacco. He reports current alcohol use. He reports that he does not use drugs. He is retired from [employer].  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  He lives in [place].  The patient's wife, Logan Murphy, is on [employer] today.  Allergies:  Allergies  Allergen Reactions  . Bee Venom Anaphylaxis    Red fire ants  . Fire [employer] Anaphylaxis  . Duloxetine Other (See Comments)    Gave him weird dreams    Current Medications: Current Outpatient Medications  Medication Sig Dispense Refill  . allopurinol (ZYLOPRIM) 300 MG tablet Take 300 mg by mouth daily.    . Alpha-Lipoic Acid 100 MG TABS Take 1 tablet by mouth 1 day or 1 dose.    . calcium carbonate (OS-CAL) 600 MG tablet Take 600 mg by mouth daily.     . Cholecalciferol 400 units CAPS Take 400 Units by mouth daily.     Marland Kitchen co-enzyme Q-10 30 MG capsule Take 30 mg by mouth daily.    . DULoxetine (CYMBALTA) 30 MG capsule Take 1 capsule by mouth daily.    Marland Kitchen EPINEPHrine 0.3 mg/0.3 mL IJ SOAJ injection Inject 0.3 mg into the muscle as needed for anaphylaxis.    . furosemide (LASIX) 20 MG tablet Take 20 mg by mouth every other day.     . hydrochlorothiazide (MICROZIDE) 12.5 MG capsule Take 12.5 mg by mouth daily.    Marland Kitchen lisinopril (PRINIVIL,ZESTRIL) 40 MG tablet Take 40 mg by mouth daily.    . tamsulosin (FLOMAX) 0.4 MG CAPS capsule Take 0.4 mg by mouth daily.     No current facility-administered medications for this visit.    Review of Systems  Constitutional: Positive for malaise/fatigue and weight loss (4 lbs; intentional). Negative for chills, diaphoresis and fever.       Feels "fine". Active.  HENT: Negative for congestion, ear discharge, ear pain, hearing loss, nosebleeds, sinus pain and sore throat.   Eyes: Negative for blurred vision.  Respiratory: Positive for cough (related to allergies). Negative for hemoptysis, sputum production and shortness of breath.   Cardiovascular: Negative for chest  pain, palpitations and leg swelling.  Gastrointestinal: Negative for abdominal pain, blood in stool, constipation, diarrhea, heartburn, melena, nausea and vomiting.       On diet.  Genitourinary: Negative for dysuria, frequency, hematuria and urgency.  Musculoskeletal: Positive for myalgias (muscle pain). Negative for back pain, joint pain and neck pain.  Skin: Negative for itching and rash.  Neurological: Positive for sensory change (neuropathy in feet; occasional fingers and heels). Negative for dizziness, tingling, weakness and headaches.  Endo/Heme/Allergies: Positive for environmental allergies. Does not bruise/bleed easily.  Psychiatric/Behavioral: Negative for depression and memory loss. The patient is not nervous/anxious and does not have insomnia.   All other systems reviewed and are negative.  Performance status (ECOG): 1  Vitals Blood pressure 129/73, Murphy (!) 56, temperature 97.7 F (36.5 C), temperature source Tympanic, resp. rate 18, height 6\' 1"  (1.854 m), weight 245 lb 11.2 oz (111.4 kg), SpO2 98 %.   Physical Exam Vitals and nursing  note reviewed.  Constitutional:      General: He is not in acute distress.    Appearance: He is well-developed and well-nourished. He is not diaphoretic.  HENT:     Head: Normocephalic and atraumatic.     Mouth/Throat:     Mouth: Oropharynx is clear and moist.     Pharynx: No oropharyngeal exudate.      Comments: Short gray hair. Male pattern baldness. Mask. Eyes:     General: No scleral icterus.    Extraocular Movements: EOM normal.     Conjunctiva/sclera: Conjunctivae normal.     Pupils: Pupils are equal, round, and reactive to light.  Cardiovascular:     Rate and Rhythm: Normal rate and regular rhythm.     Heart sounds: Normal heart sounds. No murmur heard.   Pulmonary:     Effort: Pulmonary effort is normal. No respiratory distress.     Breath sounds: Normal breath sounds. No wheezing or rales.  Chest:     Chest wall: No  tenderness.  Abdominal:     General: Bowel sounds are normal. There is no distension.     Palpations: Abdomen is soft. There is no mass.     Tenderness: There is no abdominal tenderness. There is no guarding or rebound.  Musculoskeletal:        General: No tenderness or edema. Normal range of motion.     Cervical back: Normal range of motion and neck supple.  Lymphadenopathy:     Head:     Right side of head: No preauricular, posterior auricular or occipital adenopathy.     Left side of head: No preauricular, posterior auricular or occipital adenopathy.     Cervical: No cervical adenopathy.     Upper Body:  No axillary adenopathy present.    Right upper body: No supraclavicular adenopathy.     Left upper body: No supraclavicular adenopathy.     Lower Body: No right inguinal adenopathy. No left inguinal adenopathy.  Skin:    General: Skin is warm and dry.  Neurological:     Mental Status: He is alert and oriented to person, place, and time.  Psychiatric:        Mood and Affect: Mood and affect normal.        Behavior: Behavior normal.        Thought Content: Thought content normal.        Judgment: Judgment normal.    Appointment on 02/06/2020  Component Date Value Ref Range Status  . WBC 02/06/2020 5.2  4.0 - 10.5 K/uL Final  . RBC 02/06/2020 4.72  4.22 - 5.81 MIL/uL Final  . Hemoglobin 02/06/2020 14.7  13.0 - 17.0 g/dL Final  . HCT 02/06/2020 43.2  39.0 - 52.0 % Final  . MCV 02/06/2020 91.5  80.0 - 100.0 fL Final  . MCH 02/06/2020 31.1  26.0 - 34.0 pg Final  . MCHC 02/06/2020 34.0  30.0 - 36.0 g/dL Final  . RDW 02/06/2020 13.4  11.5 - 15.5 % Final  . Platelets 02/06/2020 176  150 - 400 K/uL Final  . nRBC 02/06/2020 0.0  0.0 - 0.2 % Final  . Neutrophils Relative % 02/06/2020 64  % Final  . Neutro Abs 02/06/2020 3.3  1.7 - 7.7 K/uL Final  . Lymphocytes Relative 02/06/2020 20  % Final  . Lymphs Abs 02/06/2020 1.1  0.7 - 4.0 K/uL Final  . Monocytes Relative 02/06/2020 13  %  Final  . Monocytes Absolute 02/06/2020 0.7  0.1 -  1.0 K/uL Final  . Eosinophils Relative 02/06/2020 2  % Final  . Eosinophils Absolute 02/06/2020 0.1  0.0 - 0.5 K/uL Final  . Basophils Relative 02/06/2020 1  % Final  . Basophils Absolute 02/06/2020 0.1  0.0 - 0.1 K/uL Final  . Immature Granulocytes 02/06/2020 0  % Final  . Abs Immature Granulocytes 02/06/2020 0.02  0.00 - 0.07 K/uL Final   Performed at [facility], [address], [place], [place] [address]  . Sodium 02/06/2020 137  135 - 145 mmol/L Final  . Potassium 02/06/2020 3.8  3.5 - 5.1 mmol/L Final  . Chloride 02/06/2020 99  98 - 111 mmol/L Final  . CO2 02/06/2020 30  22 - 32 mmol/L Final  . Glucose, Bld 02/06/2020 94  70 - 99 mg/dL Final   Glucose reference range applies only to samples taken after fasting for at least 8 hours.  . BUN 02/06/2020 20  8 - 23 mg/dL Final  . Creatinine, Ser 02/06/2020 1.10  0.61 - 1.24 mg/dL Final  . Calcium 02/06/2020 9.4  8.9 - 10.3 mg/dL Final  . Total Protein 02/06/2020 6.9  6.5 - 8.1 g/dL Final  . Albumin 02/06/2020 4.1  3.5 - 5.0 g/dL Final  . AST 02/06/2020 21  15 - 41 U/L Final  . ALT 02/06/2020 27  0 - 44 U/L Final  . Alkaline Phosphatase 02/06/2020 48  38 - 126 U/L Final  . Total Bilirubin 02/06/2020 1.0  0.3 - 1.2 mg/dL Final  . GFR calc non Af Amer 02/06/2020 >60  >60 mL/min Final  . GFR calc Af Amer 02/06/2020 >60  >60 mL/min Final  . Anion gap 02/06/2020 8  5 - 15 Final   Performed at [facility], [address], [place], [place] [address]    Assessment:  TOBEN LETIZIA is a 74 y.o. male with Wegner's granulomatosis diagnosed in 12/2017. He developed renal syndrome in 08/2017. He presented with a chronic dry cough, persistent dyspnea and a  petechial vasculitic rash on his lower extremities. CT scan revealed groundglass opacities in his lungs.  Bronchoscopy was nondiagnostic. 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  There was moderate arteriosclerosis with mild interstitial fibrosis and tubular atrophy.   He was started on prednisone 60 mg a day.  Hepatitis B testing was negative on 02/07/2019.  He received Rituxan 1000 mg x 2 (02/22/2018 and 03/08/2018) with significant improvement in symptoms.  He last received Rituxan on 08/08/2019.  He received his second COVID-19 vaccine on 12/22/2019.  Symptomatically, he feels "fine". He notes muscles pain and fatigue. He has neuropathy in his feet.  Exam is stable.  Plan: 1.   Labs today: CBC with diff, CMP, hepatitis serologies.  2.   Wegener's granulomatosis  Rituxan 1000 mg IV today.  Premeds:  Benadryl 50 mg po, Tylenol 650 mg po, and Solumedrol 100 mg IV. 3.   RTC in 6 months for MD assessment, labs (CBC with diff, CMP), and Rituxan.  Addendum:  Hepatitis testing today was negative.  I discussed the assessment and treatment plan with the patient.  The patient was provided an opportunity to ask questions and all were answered.  The patient agreed with the plan and demonstrated an understanding of the instructions.  The patient was advised to call back if the symptoms worsen or if the condition fails to improve as anticipated.  Lequita Asal, MD, PhD    02/06/2020, 9:36 AM  I, Selena Batten, am acting as scribe for [employer]. Mike Gip, MD, PhD.  I, Flavius Repsher C. Mike Gip, MD, have reviewed the above documentation for accuracy and completeness, and I agree with the above.

## 2020-02-06 ENCOUNTER — Other Ambulatory Visit: Payer: Self-pay

## 2020-02-06 ENCOUNTER — Inpatient Hospital Stay: Payer: Medicare Other

## 2020-02-06 ENCOUNTER — Encounter: Payer: Self-pay | Admitting: Hematology and Oncology

## 2020-02-06 ENCOUNTER — Inpatient Hospital Stay: Payer: Medicare Other | Attending: Hematology and Oncology | Admitting: Hematology and Oncology

## 2020-02-06 VITALS — BP 170/79 | HR 68 | Temp 97.0°F | Resp 18

## 2020-02-06 VITALS — BP 129/73 | HR 56 | Temp 97.7°F | Resp 18 | Ht 73.0 in | Wt 245.7 lb

## 2020-02-06 DIAGNOSIS — Z79899 Other long term (current) drug therapy: Secondary | ICD-10-CM | POA: Diagnosis not present

## 2020-02-06 DIAGNOSIS — E079 Disorder of thyroid, unspecified: Secondary | ICD-10-CM | POA: Insufficient documentation

## 2020-02-06 DIAGNOSIS — M3131 Wegener's granulomatosis with renal involvement: Secondary | ICD-10-CM

## 2020-02-06 DIAGNOSIS — I10 Essential (primary) hypertension: Secondary | ICD-10-CM | POA: Diagnosis not present

## 2020-02-06 DIAGNOSIS — Z5112 Encounter for antineoplastic immunotherapy: Secondary | ICD-10-CM | POA: Insufficient documentation

## 2020-02-06 DIAGNOSIS — G629 Polyneuropathy, unspecified: Secondary | ICD-10-CM | POA: Diagnosis not present

## 2020-02-06 DIAGNOSIS — Z7982 Long term (current) use of aspirin: Secondary | ICD-10-CM | POA: Insufficient documentation

## 2020-02-06 LAB — COMPREHENSIVE METABOLIC PANEL
ALT: 27 U/L (ref 0–44)
AST: 21 U/L (ref 15–41)
Albumin: 4.1 g/dL (ref 3.5–5.0)
Alkaline Phosphatase: 48 U/L (ref 38–126)
Anion gap: 8 (ref 5–15)
BUN: 20 mg/dL (ref 8–23)
CO2: 30 mmol/L (ref 22–32)
Calcium: 9.4 mg/dL (ref 8.9–10.3)
Chloride: 99 mmol/L (ref 98–111)
Creatinine, Ser: 1.1 mg/dL (ref 0.61–1.24)
GFR calc Af Amer: >60 mL/min (ref >60)
GFR calc non Af Amer: >60 mL/min (ref >60)
Glucose, Bld: 94 mg/dL (ref 70–99)
Potassium: 3.8 mmol/L (ref 3.5–5.1)
Sodium: 137 mmol/L (ref 135–145)
Total Bilirubin: 1 mg/dL (ref 0.3–1.2)
Total Protein: 6.9 g/dL (ref 6.5–8.1)

## 2020-02-06 LAB — HEPATITIS C ANTIBODY: HCV Ab: NONREACTIVE

## 2020-02-06 LAB — CBC WITH DIFFERENTIAL/PLATELET
Abs Immature Granulocytes: 0.02 10*3/uL (ref 0.00–0.07)
Basophils Absolute: 0.1 10*3/uL (ref 0.0–0.1)
Basophils Relative: 1 %
Eosinophils Absolute: 0.1 10*3/uL (ref 0.0–0.5)
Eosinophils Relative: 2 %
HCT: 43.2 % (ref 39.0–52.0)
Hemoglobin: 14.7 g/dL (ref 13.0–17.0)
Immature Granulocytes: 0 %
Lymphocytes Relative: 20 %
Lymphs Abs: 1.1 10*3/uL (ref 0.7–4.0)
MCH: 31.1 pg (ref 26.0–34.0)
MCHC: 34 g/dL (ref 30.0–36.0)
MCV: 91.5 fL (ref 80.0–100.0)
Monocytes Absolute: 0.7 10*3/uL (ref 0.1–1.0)
Monocytes Relative: 13 %
Neutro Abs: 3.3 10*3/uL (ref 1.7–7.7)
Neutrophils Relative %: 64 %
Platelets: 176 10*3/uL (ref 150–400)
RBC: 4.72 MIL/uL (ref 4.22–5.81)
RDW: 13.4 % (ref 11.5–15.5)
WBC: 5.2 10*3/uL (ref 4.0–10.5)
nRBC: 0 % (ref 0.0–0.2)

## 2020-02-06 LAB — HEPATITIS B SURFACE ANTIGEN: Hepatitis B Surface Ag: NONREACTIVE

## 2020-02-06 LAB — HEPATITIS B CORE ANTIBODY, TOTAL: Hep B Core Total Ab: NONREACTIVE

## 2020-02-06 MED ORDER — SODIUM CHLORIDE 0.9 % IV SOLN
1000.0000 mg | Freq: Once | INTRAVENOUS | Status: AC
  Administered 2020-02-06: 11:00:00 1000 mg via INTRAVENOUS
  Filled 2020-02-06: qty 100

## 2020-02-06 MED ORDER — METHYLPREDNISOLONE SODIUM SUCC 125 MG IJ SOLR
100.0000 mg | Freq: Once | INTRAMUSCULAR | Status: AC
  Administered 2020-02-06: 100 mg via INTRAVENOUS

## 2020-02-06 MED ORDER — DIPHENHYDRAMINE HCL 25 MG PO CAPS
50.0000 mg | ORAL_CAPSULE | Freq: Once | ORAL | Status: AC
  Administered 2020-02-06: 10:00:00 50 mg via ORAL

## 2020-02-06 MED ORDER — SODIUM CHLORIDE 0.9 % IV SOLN
Freq: Once | INTRAVENOUS | Status: AC
  Filled 2020-02-06: qty 250

## 2020-02-06 MED ORDER — ACETAMINOPHEN 325 MG PO TABS
650.0000 mg | ORAL_TABLET | Freq: Once | ORAL | Status: AC
  Administered 2020-02-06: 10:00:00 650 mg via ORAL

## 2020-02-06 NOTE — Patient Instructions (Signed)
Rituximab injection What is this medicine? RITUXIMAB (ri TUX i mab) is a monoclonal antibody. It is used to treat certain types of cancer like non-Hodgkin lymphoma and chronic lymphocytic leukemia. It is also used to treat rheumatoid arthritis, granulomatosis with polyangiitis (or Wegener's granulomatosis), microscopic polyangiitis, and pemphigus vulgaris. This medicine may be used for other purposes; ask your health care provider or pharmacist if you have questions. COMMON BRAND NAME(S): Rituxan, RUXIENCE What should I tell my health care provider before I take this medicine? They need to know if you have any of these conditions:  heart disease  infection (especially a virus infection such as hepatitis B, chickenpox, cold sores, or herpes)  immune system problems  irregular heartbeat  kidney disease  low blood counts, like low white cell, platelet, or red cell counts  lung or breathing disease, like asthma  recently received or scheduled to receive a vaccine  an unusual or allergic reaction to rituximab, other medicines, foods, dyes, or preservatives  pregnant or trying to get pregnant  breast-feeding How should I use this medicine? This medicine is for infusion into a vein. It is administered in a hospital or clinic by a specially trained health care professional. A special MedGuide will be given to you by the pharmacist with each prescription and refill. Be sure to read this information carefully each time. Talk to your pediatrician regarding the use of this medicine in children. This medicine is not approved for use in children. Overdosage: If you think you have taken too much of this medicine contact a poison control center or emergency room at once. NOTE: This medicine is only for you. Do not share this medicine with others. What if I miss a dose? It is important not to miss a dose. Call your doctor or health care professional if you are unable to keep an appointment. What  may interact with this medicine?  cisplatin  live virus vaccines This list may not describe all possible interactions. Give your health care provider a list of all the medicines, herbs, non-prescription drugs, or dietary supplements you use. Also tell them if you smoke, drink alcohol, or use illegal drugs. Some items may interact with your medicine. What should I watch for while using this medicine? Your condition will be monitored carefully while you are receiving this medicine. You may need blood work done while you are taking this medicine. This medicine can cause serious allergic reactions. To reduce your risk you may need to take medicine before treatment with this medicine. Take your medicine as directed. In some patients, this medicine may cause a serious brain infection that may cause death. If you have any problems seeing, thinking, speaking, walking, or standing, tell your healthcare professional right away. If you cannot reach your healthcare professional, urgently seek other source of medical care. Call your doctor or health care professional for advice if you get a fever, chills or sore throat, or other symptoms of a cold or flu. Do not treat yourself. This drug decreases your body's ability to fight infections. Try to avoid being around people who are sick. Do not become pregnant while taking this medicine or for at least 12 months after stopping it. Women should inform their doctor if they wish to become pregnant or think they might be pregnant. There is a potential for serious side effects to an unborn child. Talk to your health care professional or pharmacist for more information. Do not breast-feed an infant while taking this medicine or for at   least 6 months after stopping it. What side effects may I notice from receiving this medicine? Side effects that you should report to your doctor or health care professional as soon as possible:  allergic reactions like skin rash, itching or  hives; swelling of the face, lips, or tongue  breathing problems  chest pain  changes in vision  diarrhea  headache with fever, neck stiffness, sensitivity to light, nausea, or confusion  fast, irregular heartbeat  loss of memory  low blood counts - this medicine may decrease the number of white blood cells, red blood cells and platelets. You may be at increased risk for infections and bleeding.  mouth sores  problems with balance, talking, or walking  redness, blistering, peeling or loosening of the skin, including inside the mouth  signs of infection - fever or chills, cough, sore throat, pain or difficulty passing urine  signs and symptoms of kidney injury like trouble passing urine or change in the amount of urine  signs and symptoms of liver injury like dark yellow or brown urine; general ill feeling or flu-like symptoms; light-colored stools; loss of appetite; nausea; right upper belly pain; unusually weak or tired; yellowing of the eyes or skin  signs and symptoms of low blood pressure like dizziness; feeling faint or lightheaded, falls; unusually weak or tired  stomach pain  swelling of the ankles, feet, hands  unusual bleeding or bruising  vomiting Side effects that usually do not require medical attention (report to your doctor or health care professional if they continue or are bothersome):  headache  joint pain  muscle cramps or muscle pain  nausea  tiredness This list may not describe all possible side effects. Call your doctor for medical advice about side effects. You may report side effects to FDA at 1-800-FDA-1088. Where should I keep my medicine? This drug is given in a hospital or clinic and will not be stored at home. NOTE: This sheet is a summary. It may not cover all possible information. If you have questions about this medicine, talk to your doctor, pharmacist, or health care provider.  2020 Elsevier/Gold Standard (2018-12-15  22:01:36)  

## 2020-02-06 NOTE — Progress Notes (Signed)
No new changes noted today 

## 2020-07-03 DIAGNOSIS — I209 Angina pectoris, unspecified: Secondary | ICD-10-CM | POA: Diagnosis present

## 2020-07-11 ENCOUNTER — Ambulatory Visit: Admit: 2020-07-11 | Payer: Medicare Other | Admitting: Internal Medicine

## 2020-07-11 SURGERY — LEFT HEART CATH AND CORONARY ANGIOGRAPHY
Anesthesia: Moderate Sedation | Laterality: Left

## 2020-07-16 ENCOUNTER — Ambulatory Visit: Payer: Medicare Other | Attending: Internal Medicine

## 2020-07-16 DIAGNOSIS — Z23 Encounter for immunization: Secondary | ICD-10-CM

## 2020-07-16 NOTE — Progress Notes (Signed)
   Covid-19 Vaccination Clinic  Name:  Logan Murphy    MRN: [id number] DOB: 1946-07-16  07/16/2020  Logan Murphy was observed post Covid-19 immunization for 15 minutes without incident. He was provided with Vaccine Information Sheet and instruction to access the V-Safe system.   Logan Murphy was instructed to call 911 with any severe reactions post vaccine: Marland Kitchen Difficulty breathing  . Swelling of face and throat  . A fast heartbeat  . A bad rash all over body  . Dizziness and weakness

## 2020-08-05 NOTE — Progress Notes (Signed)
[facility]  [address], [address] [place], [place] [address] Phone: [phone]  Fax: [phone]   Clinic Day:  08/06/2020  Referring physician: Tracie Harrier, MD  Chief Complaint: Logan Murphy is a 74 y.o. 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  HPI: The patient was last seen in the medical oncology clinic on 02/06/2020. At that time, he felt "fine". He noted muscle pain and fatigue. He had neuropathy in his feet.  Exam was stable.  He continues to be followed by cardiology at [facility]. He was told to continue his beta-blocker. He will undergo a cardiac catheterization for his [place] class 3 anginal symptoms on stress test; the patient decided to cancel it and discussed putting a stent in instead if/when the time comes.   He received the COVID-19 booster shot on 07/16/2020. He notes that he had a fairly big reaction to the dose consisting of extreme chills and fatigue.   During the interim, he has been well overall. He notes that he starts noticing fatigue and muscle pain around the 4 month mark of each dose of his Rituxan.   He was started on a beta-blocker by his cardiologist around a month ago.    He notes that he changed his diet by cutting out snacks.    Past Medical History:  Diagnosis Date  . 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 Chronic pain   . Gout   . Gout   . Hypertension   . Skin cancer   . Thyroid disease     Past Surgical History:  Procedure Laterality Date  . CHOLECYSTECTOMY    . GALLBLADDER SURGERY    . TOTAL HIP ARTHROPLASTY Right   . VIDEO BRONCHOSCOPY Bilateral 01/13/2018   Procedure: VIDEO BRONCHOSCOPY WITH FLUORO;  Surgeon: [name], MD;  Location: [facility] ORS;  Service: Cardiopulmonary;  Laterality: Bilateral;    Family History  Problem Relation Age of Onset  . Hypertension Other     Social History:  reports  that he has never smoked. He has never used smokeless tobacco. He reports current alcohol use. He reports that he does not use drugs. He is retired from [employer].  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  He lives in [place].  The patient is accompanied by his wife, Tye [place], today.   Allergies:  Allergies  Allergen Reactions  . Fire Dynegy Anaphylaxis  . Duloxetine Other (See Comments)    Gave him weird dreams    Current Medications: Current Outpatient Medications  Medication Sig Dispense Refill  . allopurinol (ZYLOPRIM) 300 MG tablet Take 300 mg by mouth daily.    Ilean Skill Lipoic Acid 200 MG CAPS Take 200 mg by mouth 2 (two) times daily.    . Calcium Carb-Cholecalciferol (CALCIUM 600+D) 600-800 MG-UNIT TABS Take 1 tablet by mouth daily.    . Carboxymethylcellul-Glycerin (REFRESH RELIEVA OP) Place 1 drop into both eyes 2 (two) times daily as needed (dry eyes).    . cetirizine (ZYRTEC) 10 MG tablet Take 10 mg by mouth daily as needed for allergies.    . Coenzyme Q10 (COQ-10) 100 MG CAPS Take 100 mg by mouth every evening.    Marland Kitchen EPINEPHrine 0.3 mg/0.3 mL IJ SOAJ injection Inject 0.3 mg into the muscle as needed for anaphylaxis.    . fluticasone (FLONASE) 50 MCG/ACT nasal spray Place 2 sprays into both nostrils 2 (two) times  daily as needed for allergies.     . hydrochlorothiazide (MICROZIDE) 12.5 MG capsule Take 12.5 mg by mouth daily.    Marland Kitchen ibuprofen (ADVIL) 200 MG tablet Take 400 mg by mouth every 6 (six) hours as needed for headache or moderate pain.    Marland Kitchen levothyroxine (SYNTHROID) 100 MCG tablet Take 100 mcg by mouth daily before breakfast.    . lisinopril (PRINIVIL,ZESTRIL) 40 MG tablet Take 40 mg by mouth every evening.     . Magnesium 250 MG TABS Take 250 mg by mouth daily.    . metoprolol succinate (TOPROL-XL) 25 MG 24 hr tablet Take 25 mg by mouth daily.    . pravastatin (PRAVACHOL) 20 MG tablet Take 20 mg by mouth daily.    . tamsulosin (FLOMAX) 0.4 MG CAPS capsule Take  0.4 mg by mouth daily after supper.     . furosemide (LASIX) 20 MG tablet Take 20 mg by mouth daily as needed for edema.  (Patient not taking: Reported on 08/06/2020)     No current facility-administered medications for this visit.    Review of Systems  Constitutional: Positive for chills (2 days secondary to COVID19 booster), malaise/fatigue and weight loss (2 lbs; intentional - goal 310). Negative for diaphoresis and fever.       Feels "fine". Active.  HENT: Negative for congestion, ear discharge, ear pain, hearing loss, nosebleeds, sinus pain and sore throat.   Eyes: Negative for blurred vision.  Respiratory: Positive for cough (related to allergies). Negative for hemoptysis, sputum production and shortness of breath.   Cardiovascular: Negative for chest pain, palpitations and leg swelling.  Gastrointestinal: Negative for abdominal pain, blood in stool, constipation, diarrhea, heartburn, melena, nausea and vomiting.       On diet.  Genitourinary: Negative for dysuria, frequency, hematuria and urgency.  Musculoskeletal: Positive for myalgias (muscle pain). Negative for back pain, joint pain and neck pain.  Skin: Negative for itching and rash.  Neurological: Positive for sensory change (neuropathy in feet; occasional fingers and heels). Negative for dizziness, tingling, weakness and headaches.  Endo/Heme/Allergies: Positive for environmental allergies. Does not bruise/bleed easily.  Psychiatric/Behavioral: Negative for depression and memory loss. The patient is not nervous/anxious and does not have insomnia.   All other systems reviewed and are negative.   Performance status (ECOG): 1  Vitals Blood pressure (!) 162/76, pulse (!) 51, temperature (!) 96.1 F (35.6 C), temperature source Tympanic, weight 243 lb 6.2 oz (110.4 kg), SpO2 98 %.   Physical Exam Vitals and nursing note reviewed.  Constitutional:      General: He is not in acute distress.    Appearance: He is well-developed.  He is not diaphoretic.  HENT:     Head: Normocephalic and atraumatic.     Comments: Short hair.  Male pattern baldness.    Mouth/Throat:     Pharynx: No oropharyngeal exudate.  Eyes:     General: No scleral icterus.    Conjunctiva/sclera: Conjunctivae normal.     Pupils: Pupils are equal, round, and reactive to light.     Comments: Glasses.  Blue eyes.  Cardiovascular:     Rate and Rhythm: Normal rate and regular rhythm.     Heart sounds: Normal heart sounds. No murmur heard.   Pulmonary:     Effort: Pulmonary effort is normal. No respiratory distress.     Breath sounds: Normal breath sounds. No wheezing or rales.  Chest:     Chest wall: No tenderness.  Abdominal:  General: Bowel sounds are normal. There is no distension.     Palpations: Abdomen is soft. There is no hepatomegaly, splenomegaly or mass.     Tenderness: There is no abdominal tenderness. There is no guarding or rebound.  Musculoskeletal:        General: No tenderness. Normal range of motion.     Cervical back: Normal range of motion and neck supple.  Lymphadenopathy:     Head:     Right side of head: No preauricular, posterior auricular or occipital adenopathy.     Left side of head: No preauricular, posterior auricular or occipital adenopathy.     Cervical: No cervical adenopathy.     Upper Body:     Right upper body: No supraclavicular or axillary adenopathy.     Left upper body: No supraclavicular or axillary adenopathy.     Lower Body: No right inguinal adenopathy. No left inguinal adenopathy.  Skin:    General: Skin is warm and dry.  Neurological:     Mental Status: He is alert and oriented to person, place, and time.  Psychiatric:        Behavior: Behavior normal.        Thought Content: Thought content normal.        Judgment: Judgment normal.    Orders Only on 08/06/2020  Component Date Value Ref Range Status  . Bilirubin, Direct 08/06/2020 0.2  0.0 - 0.2 mg/dL Final   Performed at [facility], [address] [place], [place] [address]  Appointment on 08/06/2020  Component Date Value Ref Range Status  . Sodium 08/06/2020 136  135 - 145 mmol/L Final  . Potassium 08/06/2020 3.9  3.5 - 5.1 mmol/L Final  . Chloride 08/06/2020 100  98 - 111 mmol/L Final  . CO2 08/06/2020 30  22 - 32 mmol/L Final  . Glucose, Bld 08/06/2020 115* 70 - 99 mg/dL Final   Glucose reference range applies only to samples taken after fasting for at least 8 hours.  . BUN 08/06/2020 18  8 - 23 mg/dL Final  . Creatinine, Ser 08/06/2020 1.11  0.61 - 1.24 mg/dL Final  . Calcium 08/06/2020 8.9  8.9 - 10.3 mg/dL Final  . Total Protein 08/06/2020 6.9  6.5 - 8.1 g/dL Final  . Albumin 08/06/2020 4.1  3.5 - 5.0 g/dL Final  . AST 08/06/2020 24  15 - 41 U/L Final  . ALT 08/06/2020 29  0 - 44 U/L Final  . Alkaline Phosphatase 08/06/2020 48  38 - 126 U/L Final  . Total Bilirubin 08/06/2020 1.4* 0.3 - 1.2 mg/dL Final  . GFR calc non Af Amer 08/06/2020 >60  >60 mL/min Final  . GFR calc Af Amer 08/06/2020 >60  >60 mL/min Final  . Anion gap 08/06/2020 6  5 - 15 Final   Performed at [facility], [address] [place], [place] [address]  . WBC 08/06/2020 5.3  4.0 - 10.5 K/uL Final  . RBC 08/06/2020 4.81  4.22 - 5.81 MIL/uL Final  . Hemoglobin 08/06/2020 14.8  13.0 - 17.0 g/dL Final  . HCT 08/06/2020 43.8  39 - 52 % Final  . MCV 08/06/2020 91.1  80.0 - 100.0 fL Final  . MCH 08/06/2020 30.8  26.0 - 34.0 pg Final  . MCHC 08/06/2020 33.8  30.0 - 36.0 g/dL Final  . RDW 08/06/2020 13.6  11.5 - 15.5 % Final  . Platelets 08/06/2020 173  150 - 400 K/uL Final  .  nRBC 08/06/2020 0.0  0.0 - 0.2 % Final  . Neutrophils Relative % 08/06/2020 69  % Final  . Neutro Abs 08/06/2020 3.7  1.7 - 7.7 K/uL Final  . Lymphocytes Relative 08/06/2020 18  % Final  . Lymphs Abs 08/06/2020 1.0  0.7 - 4.0 K/uL Final  . Monocytes Relative 08/06/2020 11  % Final  . Monocytes Absolute 08/06/2020 0.6  0 - 1 K/uL Final   . Eosinophils Relative 08/06/2020 1  % Final  . Eosinophils Absolute 08/06/2020 0.0  0 - 0 K/uL Final  . Basophils Relative 08/06/2020 1  % Final  . Basophils Absolute 08/06/2020 0.0  0 - 0 K/uL Final  . Immature Granulocytes 08/06/2020 0  % Final  . Abs Immature Granulocytes 08/06/2020 0.02  0.00 - 0.07 K/uL Final   Performed at [facility], [address] [place],  [address]    Assessment:  Logan Murphy is a 74 y.o. male with Wegner's granulomatosis diagnosed in 12/2017. He developed renal syndrome in 08/2017. He presented with a chronic dry cough, persistent dyspnea and a  petechial vasculitic rash on his lower extremities. CT scan revealed groundglass opacities in his lungs.  Bronchoscopy was nondiagnostic. 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  There was moderate arteriosclerosis with mild interstitial fibrosis and tubular atrophy.   He was started on prednisone 60 mg a day.  Hepatitis B testing was negative on 02/07/2019.  He received Rituxan 1000 mg x 2 (02/22/2018 and 03/08/2018) with significant improvement in symptoms.  He last received Rituxan on 02/06/2020.  He received the Major COVID-19 vaccines on 11/25/2019, 12/22/2019 &, 07/16/2020.  Symptomatically, he has been doing well. He notes fatigue and muscle pain starting around the 4 month mark after each dose of Rituxan.  Bilirubin is 1.4 (0.2 direct).   Plan: 1.   Labs today: CBC with diff, CMP. 2.   Wegener's granulomatosis  Rituxan 1000 mg IV today.  Premeds: Benadryl 50 mg p.o., Tylenol 650 mg p.o. and Solu-Medrol 100 mg IV. 3.   Elevated bilirubin   No GI symptoms.  Check direct bilirubin. 4.   RTC in 6 months for MD assessment, labs (CBC with diff, CMP) and Rituxan.  I discussed the assessment and treatment plan with the patient.  The patient  was provided an opportunity to ask questions and all were answered.  The patient agreed with the plan and demonstrated an understanding of the instructions.  The patient was advised to call back if the symptoms worsen or if the condition fails to improve as anticipated.   Lequita Asal, MD, PhD    08/06/2020, 1:27 PM  I, Jacqualyn Posey, am acting as a Education administrator for [employer]. Mike Gip, MD.   I, Ralphie Lovelady C. Mike Gip, MD, have reviewed the above documentation for accuracy and completeness, and I agree with the above.

## 2020-08-06 ENCOUNTER — Encounter: Payer: Self-pay | Admitting: Hematology and Oncology

## 2020-08-06 ENCOUNTER — Other Ambulatory Visit: Payer: Medicare Other

## 2020-08-06 ENCOUNTER — Inpatient Hospital Stay (HOSPITAL_BASED_OUTPATIENT_CLINIC_OR_DEPARTMENT_OTHER): Payer: Medicare Other | Admitting: Hematology and Oncology

## 2020-08-06 ENCOUNTER — Other Ambulatory Visit: Payer: Self-pay

## 2020-08-06 ENCOUNTER — Inpatient Hospital Stay: Payer: Medicare Other

## 2020-08-06 ENCOUNTER — Ambulatory Visit: Payer: Medicare Other | Admitting: Hematology and Oncology

## 2020-08-06 ENCOUNTER — Ambulatory Visit: Payer: Medicare Other

## 2020-08-06 ENCOUNTER — Inpatient Hospital Stay: Payer: Medicare Other | Attending: Hematology and Oncology

## 2020-08-06 VITALS — BP 162/76 | HR 51 | Temp 96.1°F | Wt 243.4 lb

## 2020-08-06 VITALS — BP 154/81 | HR 50

## 2020-08-06 DIAGNOSIS — Z8249 Family history of ischemic heart disease and other diseases of the circulatory system: Secondary | ICD-10-CM | POA: Insufficient documentation

## 2020-08-06 DIAGNOSIS — Z791 Long term (current) use of non-steroidal anti-inflammatories (NSAID): Secondary | ICD-10-CM | POA: Insufficient documentation

## 2020-08-06 DIAGNOSIS — Z5112 Encounter for antineoplastic immunotherapy: Secondary | ICD-10-CM | POA: Insufficient documentation

## 2020-08-06 DIAGNOSIS — M3130 Wegener's granulomatosis without renal involvement: Secondary | ICD-10-CM | POA: Insufficient documentation

## 2020-08-06 DIAGNOSIS — G629 Polyneuropathy, unspecified: Secondary | ICD-10-CM | POA: Diagnosis not present

## 2020-08-06 DIAGNOSIS — R17 Unspecified jaundice: Secondary | ICD-10-CM

## 2020-08-06 DIAGNOSIS — M3131 Wegener's granulomatosis with renal involvement: Secondary | ICD-10-CM | POA: Diagnosis not present

## 2020-08-06 DIAGNOSIS — Z9049 Acquired absence of other specified parts of digestive tract: Secondary | ICD-10-CM | POA: Diagnosis not present

## 2020-08-06 DIAGNOSIS — Z79899 Other long term (current) drug therapy: Secondary | ICD-10-CM | POA: Insufficient documentation

## 2020-08-06 DIAGNOSIS — E079 Disorder of thyroid, unspecified: Secondary | ICD-10-CM | POA: Insufficient documentation

## 2020-08-06 DIAGNOSIS — Z85828 Personal history of other malignant neoplasm of skin: Secondary | ICD-10-CM | POA: Diagnosis not present

## 2020-08-06 DIAGNOSIS — I10 Essential (primary) hypertension: Secondary | ICD-10-CM | POA: Diagnosis not present

## 2020-08-06 LAB — COMPREHENSIVE METABOLIC PANEL
ALT: 29 U/L (ref 0–44)
AST: 24 U/L (ref 15–41)
Albumin: 4.1 g/dL (ref 3.5–5.0)
Alkaline Phosphatase: 48 U/L (ref 38–126)
Anion gap: 6 (ref 5–15)
BUN: 18 mg/dL (ref 8–23)
CO2: 30 mmol/L (ref 22–32)
Calcium: 8.9 mg/dL (ref 8.9–10.3)
Chloride: 100 mmol/L (ref 98–111)
Creatinine, Ser: 1.11 mg/dL (ref 0.61–1.24)
GFR calc Af Amer: >60 mL/min (ref >60)
GFR calc non Af Amer: >60 mL/min (ref >60)
Glucose, Bld: 115 mg/dL — ABNORMAL HIGH (ref 70–99)
Potassium: 3.9 mmol/L (ref 3.5–5.1)
Sodium: 136 mmol/L (ref 135–145)
Total Bilirubin: 1.4 mg/dL — ABNORMAL HIGH (ref 0.3–1.2)
Total Protein: 6.9 g/dL (ref 6.5–8.1)

## 2020-08-06 LAB — CBC WITH DIFFERENTIAL/PLATELET
Abs Immature Granulocytes: 0.02 10*3/uL (ref 0.00–0.07)
Basophils Absolute: 0 10*3/uL (ref 0.0–0.1)
Basophils Relative: 1 %
Eosinophils Absolute: 0 10*3/uL (ref 0.0–0.5)
Eosinophils Relative: 1 %
HCT: 43.8 % (ref 39.0–52.0)
Hemoglobin: 14.8 g/dL (ref 13.0–17.0)
Immature Granulocytes: 0 %
Lymphocytes Relative: 18 %
Lymphs Abs: 1 10*3/uL (ref 0.7–4.0)
MCH: 30.8 pg (ref 26.0–34.0)
MCHC: 33.8 g/dL (ref 30.0–36.0)
MCV: 91.1 fL (ref 80.0–100.0)
Monocytes Absolute: 0.6 10*3/uL (ref 0.1–1.0)
Monocytes Relative: 11 %
Neutro Abs: 3.7 10*3/uL (ref 1.7–7.7)
Neutrophils Relative %: 69 %
Platelets: 173 10*3/uL (ref 150–400)
RBC: 4.81 MIL/uL (ref 4.22–5.81)
RDW: 13.6 % (ref 11.5–15.5)
WBC: 5.3 10*3/uL (ref 4.0–10.5)
nRBC: 0 % (ref 0.0–0.2)

## 2020-08-06 LAB — BILIRUBIN, DIRECT: Bilirubin, Direct: 0.2 mg/dL (ref 0.0–0.2)

## 2020-08-06 MED ORDER — SODIUM CHLORIDE 0.9 % IV SOLN
Freq: Once | INTRAVENOUS | Status: AC
  Filled 2020-08-06: qty 250

## 2020-08-06 MED ORDER — METHYLPREDNISOLONE SODIUM SUCC 125 MG IJ SOLR
100.0000 mg | Freq: Once | INTRAMUSCULAR | Status: AC
  Administered 2020-08-06: 100 mg via INTRAVENOUS
  Filled 2020-08-06: qty 2

## 2020-08-06 MED ORDER — SODIUM CHLORIDE 0.9 % IV SOLN: 1000.0000 mg | Freq: Once | INTRAVENOUS | Status: DC

## 2020-08-06 MED ORDER — ACETAMINOPHEN 325 MG PO TABS
650.0000 mg | ORAL_TABLET | Freq: Once | ORAL | Status: AC
  Administered 2020-08-06: 650 mg via ORAL
  Filled 2020-08-06: qty 2

## 2020-08-06 MED ORDER — DIPHENHYDRAMINE HCL 25 MG PO CAPS
50.0000 mg | ORAL_CAPSULE | Freq: Once | ORAL | Status: AC
  Administered 2020-08-06: 50 mg via ORAL
  Filled 2020-08-06: qty 2

## 2020-08-06 MED ORDER — SODIUM CHLORIDE 0.9 % IV SOLN
1000.0000 mg | Freq: Once | INTRAVENOUS | Status: AC
  Administered 2020-08-06: 1000 mg via INTRAVENOUS
  Filled 2020-08-06: qty 100

## 2020-08-06 NOTE — Progress Notes (Signed)
No new changes noted today 

## 2021-01-09 ENCOUNTER — Ambulatory Visit (INDEPENDENT_AMBULATORY_CARE_PROVIDER_SITE_OTHER): Payer: Medicare Other | Admitting: Urology

## 2021-01-09 ENCOUNTER — Encounter: Payer: Self-pay | Admitting: Urology

## 2021-01-09 ENCOUNTER — Other Ambulatory Visit: Payer: Self-pay

## 2021-01-09 VITALS — BP 183/82 | HR 64 | Ht 72.0 in | Wt 238.0 lb

## 2021-01-09 DIAGNOSIS — N401 Enlarged prostate with lower urinary tract symptoms: Secondary | ICD-10-CM

## 2021-01-09 DIAGNOSIS — R972 Elevated prostate specific antigen [PSA]: Secondary | ICD-10-CM

## 2021-01-09 NOTE — Progress Notes (Signed)
01/09/2021 11:07 AM   Logan Murphy 20-Jan-1946 [id number]  Referring provider: Gladstone Lighter, MD [place],  [place] [address]  Chief Complaint  Patient presents with  . Elevated PSA    HPI: Logan Murphy is a 75 y.o. male referred for evaluation of an elevated PSA.   PSA 12/21/2020 was 5.86   Prior biopsy ~ 8 years ago for a prostate nodule (normal PSA) in [place] which was benign  No bothersome LUTS; on tamsulosin  Denies dysuria, gross hematuria  Denies flank, abdominal or pelvic pain  No family history prostate cancer  PSA trend:    PMH: Past Medical History:  Diagnosis Date  . 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 Chronic pain   . Gout   . Gout   . Hypertension   . Skin cancer   . Thyroid disease     Surgical History: Past Surgical History:  Procedure Laterality Date  . CHOLECYSTECTOMY    . GALLBLADDER SURGERY    . TOTAL HIP ARTHROPLASTY Right   . VIDEO BRONCHOSCOPY Bilateral 01/13/2018   Procedure: VIDEO BRONCHOSCOPY WITH FLUORO;  Surgeon: [name], MD;  Location: [facility] ORS;  Service: Cardiopulmonary;  Laterality: Bilateral;    Home Medications:  Allergies as of 01/09/2021      Reactions   Fire Ant Anaphylaxis   Duloxetine Other (See Comments)   Gave him weird dreams      Medication List       Accurate as of January 09, 2021 11:07 AM. If you have any questions, ask your nurse or doctor.        allopurinol 300 MG tablet Commonly known as: ZYLOPRIM Take 300 mg by mouth daily.   Alpha Lipoic Acid 200 MG Caps Take 200 mg by mouth 2 (two) times daily.   Calcium 600+D 600-800 MG-UNIT Tabs Generic drug: Calcium Carb-Cholecalciferol Take 1 tablet by mouth daily.   cetirizine 10 MG tablet Commonly known as: ZYRTEC Take 10 mg by mouth daily as needed for allergies.   CoQ-10 100 MG Caps Take 100 mg by mouth every evening.   EPINEPHrine 0.3 mg/0.3 mL Soaj  injection Commonly known as: EPI-PEN Inject 0.3 mg into the muscle as needed for anaphylaxis.   fluticasone 50 MCG/ACT nasal spray Commonly known as: FLONASE Place 2 sprays into both nostrils 2 (two) times daily as needed for allergies.   furosemide 20 MG tablet Commonly known as: LASIX Take 20 mg by mouth daily as needed for edema.   hydrochlorothiazide 12.5 MG capsule Commonly known as: MICROZIDE Take 12.5 mg by mouth daily.   ibuprofen 200 MG tablet Commonly known as: ADVIL Take 400 mg by mouth every 6 (six) hours as needed for headache or moderate pain.   levothyroxine 100 MCG tablet Commonly known as: SYNTHROID Take 100 mcg by mouth daily before breakfast.   lisinopril 40 MG tablet Commonly known as: ZESTRIL Take 40 mg by mouth every evening.   Magnesium 250 MG Tabs Take 250 mg by mouth daily.   metoprolol succinate 25 MG 24 hr tablet Commonly known as: TOPROL-XL Take 25 mg by mouth daily.   pravastatin 20 MG tablet Commonly known as: PRAVACHOL Take 20 mg by mouth daily.   REFRESH RELIEVA OP Place 1 drop into both eyes 2 (two) times daily as needed (dry eyes).   tamsulosin 0.4 MG Caps capsule Commonly known as: FLOMAX Take 0.4 mg by mouth daily after supper.  Allergies:  Allergies  Allergen Reactions  . Fire Dynegy Anaphylaxis  . Duloxetine Other (See Comments)    Gave him weird dreams    Family History: Family History  Problem Relation Age of Onset  . Hypertension Other     Social History:  reports that he has never smoked. He has never used smokeless tobacco. He reports current alcohol use. He reports that he does not use drugs.   Physical Exam: BP (!) 183/82   Pulse 64   Ht 6' (1.829 m)   Wt 238 lb (108 kg)   BMI 32.28 kg/m   Constitutional:  Alert and oriented, No acute distress. HEENT: [place] AT, moist mucus membranes.  Trachea midline, no masses. Cardiovascular: No clubbing, cyanosis, or edema. Respiratory: Normal respiratory  effort, no increased work of breathing. GI: Abdomen is soft, nontender, nondistended, no abdominal masses GU: Prostate 40 g, smooth; some asymmetry L >R however consistency normal and uniform throughout Skin: No rashes, bruises or suspicious lesions. Neurologic: Grossly intact, no focal deficits, moving all 4 extremities. Psychiatric: Normal mood and affect.   Assessment & Plan:    1.  Elevated PSA  Mild PSA elevation which is slowly rising  Benign DRE  We discussed statistically for a PSA <10 and benign DRE the chances of prostate cancer are ~ 20% and only 4-5% high-grade prostate cancer  Although PSA is a prostate cancer screening test he was informed that cancer is not the most common cause of an elevated PSA. Other potential causes including BPH and inflammation were discussed. He was informed that the only way to adequately diagnose prostate cancer would be a transrectal ultrasound and biopsy of the prostate. The procedure was discussed including potential risks of bleeding and infection/sepsis. He was also informed that a negative biopsy does not conclusively rule out the possibility that prostate cancer may be present and that continued monitoring is required. The use of newer adjunctive blood tests including PHI and 4kScore were discussed. The use of multiparametric prostate MRI to identify lesions suspicious for high-grade prostate cancer with the availability of targeted biopsy was reviewed. Continued periodic surveillance was also discussed.  At this point he has elected surveillance and will follow up in 6 months for repeat DRE.  He states a PSA will be drawn with his annual physical at that time   Abbie Sons, MD  [facility] [address] [place], [place] [address] [phone]

## 2021-01-10 ENCOUNTER — Encounter: Payer: Self-pay | Admitting: Urology

## 2021-01-31 NOTE — Progress Notes (Signed)
[facility]  [address], [address] [place], [place] [address] Phone: [phone]  Fax: [phone]   Clinic Day: 02/04/21  Referring physician: Tracie Harrier, MD  Chief Complaint: Logan Murphy is a 75 y.o. 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  HPI: The patient was last seen in the medical oncology clinic on 08/06/2020. At that time, he had been doing well. He noted fatigue and muscle pain starting around the 4 month mark after each dose of Rituxan. Hematocrit was 43.8, hemoglobin 14.8, platelets 173,000, WBC 5,300. Bilirubin was 1.4 (0.2 direct). He received Rituxan.  The patient was seen by Dr. Jefm Bryant on 12/27/2020.  His major complaint is numbness in hands and feet. He had poor toleration of both Cymbalta and gabapentin.  Symptoms felt like they got a little worse prior to his infusion. He had no shortness of breath or cough. Recent antibodies and sed rate, CBC, and creatinine were unremarkable.  The patient saw Dr. Bernardo Heater on 01/09/2021 for an elevated PSA that was slowly rising. They discussed the causes of an elevated PSA and the process of diagnosing prostate cancer. The patient elected to continue surveillance. PSA was 3.85 on 09/16/2018, 4.95 on 12/07/2018, 4.74 on 12/22/2019, and 5.86 on 12/21/2020.   During the interim, he has been good. He has neuropathy and is doing acupuncture, which provides temporary relief. He starts feeling fatigue, muscle pain, sharp pains, and neuropathy about a month before his infusions. He still has a minor cough related to allergies.  Per patient, Dr. Jefm Bryant wants the patient to have treatments every 5 months. He has not had any blood transfusions recently and denies any known exposure to hepatitis.  He is going on a cruise this summer and gets back on 07/13/2021.   Past Medical History:  Diagnosis Date  . 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 Chronic pain   . Gout   . Gout   . Hypertension   . Skin cancer   . Thyroid disease     Past Surgical History:  Procedure Laterality Date  . CHOLECYSTECTOMY    . GALLBLADDER SURGERY    . TOTAL HIP ARTHROPLASTY Right   . VIDEO BRONCHOSCOPY Bilateral 01/13/2018   Procedure: VIDEO BRONCHOSCOPY WITH FLUORO;  Surgeon: [name], MD;  Location: [facility] ORS;  Service: Cardiopulmonary;  Laterality: Bilateral;    Family History  Problem Relation Age of Onset  . Hypertension Other     Social History:  reports that he has never smoked. He has never used smokeless tobacco. He reports current alcohol use. He reports that he does not use drugs. He is retired from [employer].  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  He lives in [place]. His wife, Tye [place], is a retired Marine scientist. The patient is accompanied by his wife, Tye [place], today.   Allergies:  Allergies  Allergen Reactions  . Fire [employer] Anaphylaxis  . Duloxetine Other (See Comments)    Gave him weird dreams    Current Medications: Current Outpatient Medications  Medication Sig Dispense Refill  . allopurinol (ZYLOPRIM) 300 MG tablet Take 300 mg by mouth daily.    Ilean Skill Lipoic Acid 200 MG CAPS Take 200 mg by mouth 2 (two) times daily.    . Carboxymethylcellul-Glycerin (REFRESH RELIEVA OP) Place 1 drop into both eyes 2 (two) times daily as needed (dry eyes).    . cetirizine (ZYRTEC) 10 MG  tablet Take 10 mg by mouth daily as needed for allergies.    . Coenzyme Q10 (COQ-10) 100 MG CAPS Take 100 mg by mouth every evening.    Marland Kitchen EPINEPHrine 0.3 mg/0.3 mL IJ SOAJ injection Inject 0.3 mg into the muscle as needed for anaphylaxis.    . fluticasone (FLONASE) 50 MCG/ACT nasal spray Place 2 sprays into both nostrils 2 (two) times daily as needed for allergies.     . hydrochlorothiazide (MICROZIDE) 12.5 MG capsule Take 12.5 mg by mouth daily.    Marland Kitchen ibuprofen (ADVIL) 200 MG tablet Take 400 mg by mouth  every 6 (six) hours as needed for headache or moderate pain.    Marland Kitchen levothyroxine (SYNTHROID) 100 MCG tablet Take 100 mcg by mouth daily before breakfast.    . lisinopril (PRINIVIL,ZESTRIL) 40 MG tablet Take 40 mg by mouth every evening.     . metoprolol succinate (TOPROL-XL) 25 MG 24 hr tablet Take 25 mg by mouth daily.    . pravastatin (PRAVACHOL) 20 MG tablet Take 20 mg by mouth daily.    . tamsulosin (FLOMAX) 0.4 MG CAPS capsule Take 0.4 mg by mouth daily after supper.      No current facility-administered medications for this visit.    Review of Systems  Constitutional: Positive for malaise/fatigue. Negative for chills, diaphoresis, fever and weight loss (up 7 lbs).  HENT: Negative for congestion, ear discharge, ear pain, hearing loss, nosebleeds, sinus pain, sore throat and tinnitus.   Eyes: Negative for blurred vision.  Respiratory: Positive for cough (related to allergies, minor). Negative for hemoptysis, sputum production and shortness of breath.   Cardiovascular: Negative for chest pain, palpitations and leg swelling.  Gastrointestinal: Negative for abdominal pain, blood in stool, constipation, diarrhea, heartburn, melena, nausea and vomiting.  Genitourinary: Negative for dysuria, frequency, hematuria and urgency.  Musculoskeletal: Positive for myalgias (muscle pain). Negative for back pain, joint pain and neck pain.  Skin: Negative for itching and rash.  Neurological: Positive for sensory change (neuropathy in feet; occasional fingers and heels). Negative for dizziness, tingling, weakness and headaches.  Endo/Heme/Allergies: Positive for environmental allergies. Does not bruise/bleed easily.  Psychiatric/Behavioral: Negative for depression and memory loss. The patient is not nervous/anxious and does not have insomnia.   All other systems reviewed and are negative.  Performance status (ECOG): 1  Vitals Blood pressure (!) 179/84, pulse (!) 54, temperature (!) 96.4 F (35.8 C),  temperature source Tympanic, resp. rate 18, weight 250 lb 5.3 oz (113.6 kg), SpO2 99 %.   Physical Exam Vitals and nursing note reviewed.  Constitutional:      General: He is not in acute distress.    Appearance: He is well-developed. He is not diaphoretic.  HENT:     Head: Normocephalic and atraumatic.     Comments: Short hair.  Male pattern baldness.    Mouth/Throat:     Pharynx: No oropharyngeal exudate.  Eyes:     General: No scleral icterus.    Conjunctiva/sclera: Conjunctivae normal.     Pupils: Pupils are equal, round, and reactive to light.     Comments: Glasses.  Blue eyes.  Cardiovascular:     Rate and Rhythm: Normal rate and regular rhythm.     Heart sounds: Normal heart sounds. No murmur heard.   Pulmonary:     Effort: Pulmonary effort is normal. No respiratory distress.     Breath sounds: Normal breath sounds. No wheezing or rales.  Chest:     Chest wall: No tenderness.  Breasts:     Right: No axillary adenopathy or supraclavicular adenopathy.     Left: No axillary adenopathy or supraclavicular adenopathy.    Abdominal:     General: Bowel sounds are normal. There is no distension.     Palpations: Abdomen is soft. There is no hepatomegaly, splenomegaly or mass.     Tenderness: There is no abdominal tenderness. There is no guarding or rebound.  Musculoskeletal:        General: No tenderness. Normal range of motion.     Cervical back: Normal range of motion and neck supple.  Lymphadenopathy:     Head:     Right side of head: No preauricular, posterior auricular or occipital adenopathy.     Left side of head: No preauricular, posterior auricular or occipital adenopathy.     Cervical: No cervical adenopathy.     Upper Body:     Right upper body: No supraclavicular or axillary adenopathy.     Left upper body: No supraclavicular or axillary adenopathy.     Lower Body: No right inguinal adenopathy. No left inguinal adenopathy.  Skin:    General: Skin is warm and  dry.  Neurological:     Mental Status: He is alert and oriented to person, place, and time.  Psychiatric:        Behavior: Behavior normal.        Thought Content: Thought content normal.        Judgment: Judgment normal.    Appointment on 02/04/2021  Component Date Value Ref Range Status  . Sodium 02/04/2021 138  135 - 145 mmol/L Final  . Potassium 02/04/2021 4.1  3.5 - 5.1 mmol/L Final  . Chloride 02/04/2021 100  98 - 111 mmol/L Final  . CO2 02/04/2021 29  22 - 32 mmol/L Final  . Glucose, Bld 02/04/2021 101* 70 - 99 mg/dL Final   Glucose reference range applies only to samples taken after fasting for at least 8 hours.  . BUN 02/04/2021 24* 8 - 23 mg/dL Final  . Creatinine, Ser 02/04/2021 1.04  0.61 - 1.24 mg/dL Final  . Calcium 02/04/2021 9.5  8.9 - 10.3 mg/dL Final  . Total Protein 02/04/2021 7.3  6.5 - 8.1 g/dL Final  . Albumin 02/04/2021 4.1  3.5 - 5.0 g/dL Final  . AST 02/04/2021 23  15 - 41 U/L Final  . ALT 02/04/2021 29  0 - 44 U/L Final  . Alkaline Phosphatase 02/04/2021 51  38 - 126 U/L Final  . Total Bilirubin 02/04/2021 0.8  0.3 - 1.2 mg/dL Final  . GFR, Estimated 02/04/2021 >60  >60 mL/min Final   Comment: (NOTE) Calculated using the CKD-EPI Creatinine Equation (2021)   . Anion gap 02/04/2021 9  5 - 15 Final   Performed at [facility], [address], [place], [place] [address]  . WBC 02/04/2021 5.7  4.0 - 10.5 K/uL Final  . RBC 02/04/2021 4.87  4.22 - 5.81 MIL/uL Final  . Hemoglobin 02/04/2021 15.2  13.0 - 17.0 g/dL Final  . HCT 02/04/2021 44.4  39.0 - 52.0 % Final  . MCV 02/04/2021 91.2  80.0 - 100.0 fL Final  . MCH 02/04/2021 31.2  26.0 - 34.0 pg Final  . MCHC 02/04/2021 34.2  30.0 - 36.0 g/dL Final  . RDW 02/04/2021 13.6  11.5 - 15.5 % Final  . Platelets 02/04/2021 185  150 - 400 K/uL Final  . nRBC 02/04/2021 0.0  0.0 - 0.2 % Final  .  Neutrophils Relative % 02/04/2021 63  % Final  . Neutro Abs 02/04/2021 3.6  1.7 - 7.7 K/uL Final  .  Lymphocytes Relative 02/04/2021 20  % Final  . Lymphs Abs 02/04/2021 1.2  0.7 - 4.0 K/uL Final  . Monocytes Relative 02/04/2021 13  % Final  . Monocytes Absolute 02/04/2021 0.7  0.1 - 1.0 K/uL Final  . Eosinophils Relative 02/04/2021 2  % Final  . Eosinophils Absolute 02/04/2021 0.1  0.0 - 0.5 K/uL Final  . Basophils Relative 02/04/2021 1  % Final  . Basophils Absolute 02/04/2021 0.1  0.0 - 0.1 K/uL Final  . Immature Granulocytes 02/04/2021 1  % Final  . Abs Immature Granulocytes 02/04/2021 0.04  0.00 - 0.07 K/uL Final   Performed at [facility], [address], [place], [place] [address]    Assessment:  Logan Murphy is a 75 y.o. male with Wegner's granulomatosis diagnosed in 12/2017. He developed renal syndrome in 08/2017. He presented with a chronic dry cough, persistent dyspnea and a  petechial vasculitic rash on his lower extremities. CT scan revealed groundglass opacities in his lungs.  Bronchoscopy was nondiagnostic. 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  There was moderate arteriosclerosis with mild interstitial fibrosis and tubular atrophy.   He was started on prednisone 60 mg a day.  Hepatitis B testing was negative on 02/07/2019.  He received Rituxan 1000 mg x 2 (02/22/2018 and 03/08/2018) with significant improvement in symptoms.  He last received Rituxan on 08/06/2020.  He has an elevated PSA.  PSA has been followed:  3.85 on 09/16/2018, 4.95 on 12/07/2018, 4.74 on 12/22/2019, and 5.86 on 12/21/2020.  He is followed by Dr Bernardo Heater.  He received the [place] COVID-19 vaccines on 11/25/2019, 12/22/2019 &, 07/16/2020.  Symptomatically, he has felt good. He has neuropathy and is doing acupuncture, which provides temporary relief. He starts feeling fatigue, muscle pain, sharp pains, and neuropathy about a month before his  infusions. He still has a minor cough related to allergies.  Exam is stable.  Plan: 1.   Labs today: CBC with diff, CMP. 2.   Wegener's granulomatosis  Rituxan 1000 mg IV today.  Premeds: Benadryl 50 mg p.o., Tylenol 650 mg p.o. and Solu-Medrol 100 mg IV. 3.   Elevated bilirubin, resolved  Bilirubin was 1.4 on 08/06/2020 and 0.8 today.  Monitor with infusions. 4.   RN:  Confirm hepatitis B testing "ok" (no risk factors). 5.   Rituxan today. 6.   RTC in 5 months (1st week in Sept as patient will be out of town until 07/13/2021) for MD assessment, labs (CBC with diff, CMP) and Rituxan.  I discussed the assessment and treatment plan with the patient.  The patient was provided an opportunity to ask questions and all were answered.  The patient agreed with the plan and demonstrated an understanding of the instructions.  The patient was advised to call back if the symptoms worsen or if the condition fails to improve as anticipated.   Lequita Asal, MD, PhD 02/04/2021, 5:00 PM  I, Mirian Mo Tufford, am acting as a Education administrator for [employer]. Mike Gip, MD.   I, Ola Fawver C. Mike Gip, MD, have reviewed the above documentation for accuracy and completeness, and I agree with the above.

## 2021-02-02 DIAGNOSIS — M3131 Wegener's granulomatosis with renal involvement: Secondary | ICD-10-CM | POA: Insufficient documentation

## 2021-02-04 ENCOUNTER — Encounter: Payer: Self-pay | Admitting: Hematology and Oncology

## 2021-02-04 ENCOUNTER — Inpatient Hospital Stay: Payer: Medicare Other

## 2021-02-04 ENCOUNTER — Encounter: Payer: Self-pay | Admitting: Emergency Medicine

## 2021-02-04 ENCOUNTER — Ambulatory Visit
Admission: EM | Admit: 2021-02-04 | Discharge: 2021-02-04 | Disposition: A | Discharge status: Home / Self Care | Payer: Medicare Other | Attending: Family Medicine | Admitting: Family Medicine

## 2021-02-04 ENCOUNTER — Inpatient Hospital Stay: Payer: Medicare Other | Attending: Hematology and Oncology

## 2021-02-04 ENCOUNTER — Other Ambulatory Visit: Payer: Self-pay

## 2021-02-04 ENCOUNTER — Inpatient Hospital Stay (HOSPITAL_BASED_OUTPATIENT_CLINIC_OR_DEPARTMENT_OTHER): Payer: Medicare Other | Admitting: Hematology and Oncology

## 2021-02-04 ENCOUNTER — Telehealth: Payer: Self-pay | Admitting: *Deleted

## 2021-02-04 VITALS — BP 214/92 | HR 67

## 2021-02-04 VITALS — BP 179/84 | HR 54 | Temp 96.4°F | Resp 18 | Wt 250.3 lb

## 2021-02-04 DIAGNOSIS — M3131 Wegener's granulomatosis with renal involvement: Secondary | ICD-10-CM

## 2021-02-04 DIAGNOSIS — Z5112 Encounter for antineoplastic immunotherapy: Secondary | ICD-10-CM | POA: Diagnosis present

## 2021-02-04 DIAGNOSIS — M3130 Wegener's granulomatosis without renal involvement: Secondary | ICD-10-CM | POA: Insufficient documentation

## 2021-02-04 DIAGNOSIS — I160 Hypertensive urgency: Secondary | ICD-10-CM | POA: Diagnosis not present

## 2021-02-04 LAB — CBC WITH DIFFERENTIAL/PLATELET
Abs Immature Granulocytes: 0.04 10*3/uL (ref 0.00–0.07)
Basophils Absolute: 0.1 10*3/uL (ref 0.0–0.1)
Basophils Relative: 1 %
Eosinophils Absolute: 0.1 10*3/uL (ref 0.0–0.5)
Eosinophils Relative: 2 %
HCT: 44.4 % (ref 39.0–52.0)
Hemoglobin: 15.2 g/dL (ref 13.0–17.0)
Immature Granulocytes: 1 %
Lymphocytes Relative: 20 %
Lymphs Abs: 1.2 10*3/uL (ref 0.7–4.0)
MCH: 31.2 pg (ref 26.0–34.0)
MCHC: 34.2 g/dL (ref 30.0–36.0)
MCV: 91.2 fL (ref 80.0–100.0)
Monocytes Absolute: 0.7 10*3/uL (ref 0.1–1.0)
Monocytes Relative: 13 %
Neutro Abs: 3.6 10*3/uL (ref 1.7–7.7)
Neutrophils Relative %: 63 %
Platelets: 185 10*3/uL (ref 150–400)
RBC: 4.87 MIL/uL (ref 4.22–5.81)
RDW: 13.6 % (ref 11.5–15.5)
WBC: 5.7 10*3/uL (ref 4.0–10.5)
nRBC: 0 % (ref 0.0–0.2)

## 2021-02-04 LAB — COMPREHENSIVE METABOLIC PANEL
ALT: 29 U/L (ref 0–44)
AST: 23 U/L (ref 15–41)
Albumin: 4.1 g/dL (ref 3.5–5.0)
Alkaline Phosphatase: 51 U/L (ref 38–126)
Anion gap: 9 (ref 5–15)
BUN: 24 mg/dL — ABNORMAL HIGH (ref 8–23)
CO2: 29 mmol/L (ref 22–32)
Calcium: 9.5 mg/dL (ref 8.9–10.3)
Chloride: 100 mmol/L (ref 98–111)
Creatinine, Ser: 1.04 mg/dL (ref 0.61–1.24)
GFR, Estimated: >60 mL/min (ref >60)
Glucose, Bld: 101 mg/dL — ABNORMAL HIGH (ref 70–99)
Potassium: 4.1 mmol/L (ref 3.5–5.1)
Sodium: 138 mmol/L (ref 135–145)
Total Bilirubin: 0.8 mg/dL (ref 0.3–1.2)
Total Protein: 7.3 g/dL (ref 6.5–8.1)

## 2021-02-04 MED ORDER — SODIUM CHLORIDE 0.9 % IV SOLN
1000.0000 mg | Freq: Once | INTRAVENOUS | Status: DC
  Administered 2021-02-04: 1000 mg via INTRAVENOUS
  Filled 2021-02-04: qty 100

## 2021-02-04 MED ORDER — SODIUM CHLORIDE 0.9 % IV SOLN
Freq: Once | INTRAVENOUS | Status: AC
  Filled 2021-02-04: qty 250

## 2021-02-04 MED ORDER — ACETAMINOPHEN 325 MG PO TABS
650.0000 mg | ORAL_TABLET | Freq: Once | ORAL | Status: AC
  Administered 2021-02-04: 650 mg via ORAL
  Filled 2021-02-04: qty 2

## 2021-02-04 MED ORDER — METHYLPREDNISOLONE SODIUM SUCC 125 MG IJ SOLR
100.0000 mg | Freq: Once | INTRAMUSCULAR | Status: AC
  Administered 2021-02-04: 100 mg via INTRAVENOUS
  Filled 2021-02-04: qty 2

## 2021-02-04 MED ORDER — HYDROCHLOROTHIAZIDE 25 MG PO TABS: 25.0000 mg | ORAL_TABLET | Freq: Every day | ORAL | 0 refills | Status: AC

## 2021-02-04 MED ORDER — DIPHENHYDRAMINE HCL 25 MG PO CAPS
50.0000 mg | ORAL_CAPSULE | Freq: Once | ORAL | Status: DC
  Administered 2021-02-04: 50 mg via ORAL
  Filled 2021-02-04: qty 2

## 2021-02-04 NOTE — ED Triage Notes (Signed)
Patient was sent over from the cancer center. He states he was receiving his Rituxan infusion and his BP continued to elevate. Most recent was 214/92. Infusion was stopped and he was sent here to UC. Patient denies headache or any other symptoms.

## 2021-02-04 NOTE — Discharge Instructions (Signed)
Rest.  I have increased your HCTZ.  Follow up with your physicians.  Take care  Dr. Lacinda Axon

## 2021-02-04 NOTE — Progress Notes (Signed)
Patient here for oncology follow-up appointment, expresses concerns of neuropathy & pushing up infusions to every 5 months instead of 6

## 2021-02-04 NOTE — Telephone Encounter (Signed)
Pt b/p was going up higher every b/p that was taken. His rituxan was stopped and he got 60 ml per Vicky in infusion. We called Nehemiah Massed because he had changed some b/p med. I called his office and was told that it had been a while and for [place] to call PCP. I called PCP Dr. Tressia Miners and spoke to charge nurse Hassan Rowan and she said that with the numbers we gave her that he should go to urgent care and I took him over. Later today he came back and was told to double HCTZ and he said for [place] to check with [place] or dr Jefm Bryant. Dr. Mike Gip did check with Dr. Jefm Bryant and they talked about not giving steroid tom. Since he had it today and we will check v/s and see if he is able to complete the rituxan tom. And pt is aware of all of this. The ranges of b/p were from 188/77 to 214/92. The numbers are in infusion notes

## 2021-02-05 ENCOUNTER — Inpatient Hospital Stay: Payer: Medicare Other

## 2021-02-05 VITALS — BP 167/76 | HR 53 | Temp 96.0°F | Resp 18

## 2021-02-05 DIAGNOSIS — Z5112 Encounter for antineoplastic immunotherapy: Secondary | ICD-10-CM | POA: Diagnosis not present

## 2021-02-05 DIAGNOSIS — M3131 Wegener's granulomatosis with renal involvement: Secondary | ICD-10-CM

## 2021-02-05 MED ORDER — ACETAMINOPHEN 325 MG PO TABS
650.0000 mg | ORAL_TABLET | Freq: Once | ORAL | Status: AC
  Administered 2021-02-05: 650 mg via ORAL
  Filled 2021-02-05: qty 2

## 2021-02-05 MED ORDER — ACETAMINOPHEN 325 MG PO TABS: 650.0000 mg | ORAL_TABLET | Freq: Once | ORAL | Status: AC

## 2021-02-05 MED ORDER — DIPHENHYDRAMINE HCL 25 MG PO CAPS
50.0000 mg | ORAL_CAPSULE | Freq: Once | ORAL | Status: AC
  Administered 2021-02-05: 50 mg via ORAL
  Filled 2021-02-05: qty 2

## 2021-02-05 MED ORDER — SODIUM CHLORIDE 0.9 % IV SOLN: 1000.0000 mg | Freq: Once | INTRAVENOUS | Status: AC

## 2021-02-05 MED ORDER — SODIUM CHLORIDE 0.9 % IV SOLN
1000.0000 mg | Freq: Once | INTRAVENOUS | Status: AC
  Administered 2021-02-05: 1000 mg via INTRAVENOUS
  Filled 2021-02-05: qty 100

## 2021-02-05 MED ORDER — RITUXIMAB-PVVR CHEMO 500 MG/50ML IV SOLN: 1000.0000 mg | Freq: Once | INTRAVENOUS | Status: DC

## 2021-02-05 MED ORDER — DIPHENHYDRAMINE HCL 25 MG PO CAPS: 50.0000 mg | ORAL_CAPSULE | Freq: Once | ORAL | Status: AC

## 2021-02-05 NOTE — Addendum Note (Signed)
Addended by: Charlyn Minerva on: 02/05/2021 09:16 AM   Modules accepted: Orders

## 2021-02-05 NOTE — ED Provider Notes (Signed)
[facility]    CSN: [id number] Arrival date & time: 02/04/21  1440      History   Chief Complaint Chief Complaint  Patient presents with  . Hypertension   HPI  75 year old male presents for evaluation of elevated blood pressure.  Patient was receiving an infusion of Rituximab today at the cancer center.  His blood pressure was markedly elevated at 214/92.  His infusion was stopped and he was sent here for evaluation.  He is somewhat concerned about his elevated blood pressure.  This is atypical for him.  He is currently on metoprolol, lisinopril, and HCTZ.  He endorses compliance with his medication.  Patient states that he was premedicated with Benadryl.  I believe that he received steroids as well.  He denies chest pain or shortness of breath.  He is essentially asymptomatic at this time.   Past Medical History:  Diagnosis Date  . Autonomic neuropathy due to disorder of immune function ([place])   . Chronic pain   . Gout   . Gout   . Hypertension   . Skin cancer   . Thyroid disease     Patient Active Problem List   Diagnosis Date Noted  . 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 Angina pectoris ([place]) 07/03/2020  . Sensation of fullness in right ear 12/28/2018  . Idiopathic peripheral neuropathy 09/28/2018  . Hyperlipidemia, mixed 08/03/2018  . Numbness of toes 05/19/2018  . Swollen leg 05/19/2018  . 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 Encounter for long-term (current) use of high-risk medication 02/18/2018  . Wegener's granulomatosis with vasculitis 01/28/2018  . Community acquired bacterial pneumonia 01/07/2018  . Positive anti-CCP test 01/07/2018  . Dyspnea and respiratory abnormalities 12/31/2017  . Myalgia 12/31/2017  . Other fatigue 12/31/2017  . Elevated rheumatoid factor 11/26/2017  . Ascending aortic aneurysm ([place]) 11/04/2017  . Coronary artery disease involving native  coronary artery of native heart 11/04/2017  . Aneurysmal dilatation ([place]) 10/27/2017  . Aortic atherosclerosis ([place]) 10/27/2017  . Coronary artery calcification 10/27/2017  . Acquired hypothyroidism 03/03/2017  . Benign essential hypertension 03/03/2017  . Elevated PSA 03/03/2017  . 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 Ileus ([place]) 02/03/2017  . Enteritis     Past Surgical History:  Procedure Laterality Date  . CHOLECYSTECTOMY    . GALLBLADDER SURGERY    . TOTAL HIP ARTHROPLASTY Right   . VIDEO BRONCHOSCOPY Bilateral 01/13/2018   Procedure: VIDEO BRONCHOSCOPY WITH FLUORO;  Surgeon: [name], MD;  Location: [facility] ORS;  Service: Cardiopulmonary;  Laterality: Bilateral;       Home Medications    Prior to Admission medications   Medication Sig Start Date End Date Taking? Authorizing Provider  allopurinol (ZYLOPRIM) 300 MG tablet Take 300 mg by mouth daily.   Yes [provider]  Alpha Lipoic Acid 200 MG CAPS Take 200 mg by mouth 2 (two) times daily.   Yes [provider]  Carboxymethylcellul-Glycerin (REFRESH RELIEVA OP) Place 1 drop into both eyes 2 (two) times daily as needed (dry eyes).   Yes [provider]  cetirizine (ZYRTEC) 10 MG tablet Take 10 mg by mouth daily as needed for allergies.   Yes [provider]  Coenzyme Q10 (COQ-10) 100 MG CAPS Take 100 mg by mouth every evening.   Yes [provider]  EPINEPHrine 0.3 mg/0.3 mL IJ SOAJ injection Inject 0.3 mg into the muscle as needed for anaphylaxis.   Yes [provider]  fluticasone (FLONASE) 50 MCG/ACT nasal spray Place 2 sprays into both nostrils 2 (two) times daily as needed for allergies.  05/25/20  Yes [provider]  hydrochlorothiazide (HYDRODIURIL) 25 MG tablet Take 1 tablet (25 mg total) by mouth daily. 02/04/21  Yes Lovie Agresta G, DO  levothyroxine (SYNTHROID) 100 MCG tablet Take 100 mcg by mouth daily before breakfast.   Yes  [provider]  lisinopril (PRINIVIL,ZESTRIL) 40 MG tablet Take 40 mg by mouth every evening.    Yes [provider]  metoprolol succinate (TOPROL-XL) 25 MG 24 hr tablet Take 25 mg by mouth daily. 07/03/20  Yes [provider]  pravastatin (PRAVACHOL) 20 MG tablet Take 20 mg by mouth daily. 07/03/20  Yes [provider]  tamsulosin (FLOMAX) 0.4 MG CAPS capsule Take 0.4 mg by mouth daily after supper.    Yes [provider]    Family History Family History  Problem Relation Age of Onset  . Hypertension Other     Social History Social History   Tobacco Use  . Smoking status: Never Smoker  . Smokeless tobacco: Never Used  Vaping Use  . Vaping Use: Never used  Substance Use Topics  . Alcohol use: Yes  . Drug use: No     Allergies   Fire ant and Duloxetine   Review of Systems Review of Systems  Respiratory: Negative.   Cardiovascular: Negative.    Physical Exam Triage Vital Signs ED Triage Vitals  Enc Vitals Group     BP 02/04/21 1514 (!) 193/100     Pulse Rate 02/04/21 1514 72     Resp 02/04/21 1514 18     Temp --      Temp src --      SpO2 02/04/21 1514 98 %     Weight 02/04/21 1511 245 lb (111.1 kg)     Height 02/04/21 1511 6\' 1"  (1.854 m)     Head Circumference --      Peak Flow --      Pain Score 02/04/21 1511 0     Pain Loc --      Pain Edu? --      Excl. in [place]? --     Updated Vital Signs BP (!) 190/90 (BP Location: Right Arm)   Pulse 72   Resp 18   Ht 6\' 1"  (1.854 m)   Wt 111.1 kg   SpO2 98%   BMI 32.32 kg/m   Visual Acuity Right Eye Distance:   Left Eye Distance:   Bilateral Distance:    Right Eye Near:   Left Eye Near:    Bilateral Near:     Physical Exam Vitals and nursing note reviewed.  Constitutional:      General: He is not in acute distress.    Appearance: Normal appearance. He is not ill-appearing.  HENT:     Head: Normocephalic and atraumatic.  Cardiovascular:     Rate and  Rhythm: Normal rate and regular rhythm.  Pulmonary:     Effort: Pulmonary effort is normal.     Breath sounds: Normal breath sounds. No wheezing, rhonchi or rales.  Neurological:     General: No focal deficit present.     Mental Status: He is alert. Mental status is at baseline.  Psychiatric:        Mood and Affect: Mood normal.        Behavior: Behavior normal.    UC Treatments / Results  Labs (all labs  ordered are listed, but only abnormal results are displayed) Labs Reviewed - No data to display  EKG   Radiology No results found.  Procedures Procedures (including critical care time)  Medications Ordered in UC Medications - No data to display  Initial Impression / Assessment and Plan / UC Course  I have reviewed the triage vital signs and the nursing notes.  Pertinent labs & imaging results that were available during my care of the patient were reviewed by me and considered in my medical decision making (see chart for details).    75 year old male presents with hypertensive urgency.  I have increased his HCTZ.  This may be a side effect of the Rituxan itself or possibly a corticosteroid that he was given.  Hypertension is a known side effect of Rituxan.  I advised him to discuss this with his physicians.  Supportive care.  Final Clinical Impressions(s) / UC Diagnoses   Final diagnoses:  Hypertensive urgency     Discharge Instructions     Rest.  I have increased your HCTZ.  Follow up with your physicians.  Take care  Dr. Lacinda Axon    ED Prescriptions    Medication Sig Dispense Auth. Provider   hydrochlorothiazide (HYDRODIURIL) 25 MG tablet Take 1 tablet (25 mg total) by mouth daily. 90 tablet Thersa Salt G, DO     PDMP not reviewed this encounter.   Coral Spikes, [place] 02/05/21 1422

## 2021-02-05 NOTE — Addendum Note (Signed)
Addended by: Charlyn Minerva on: 02/05/2021 09:22 AM   Modules accepted: Orders

## 2021-02-25 ENCOUNTER — Other Ambulatory Visit
Admission: RE | Admit: 2021-02-25 | Discharge: 2021-02-25 | Disposition: A | Discharge status: Home / Self Care | Payer: Medicare Other | Source: Ambulatory Visit | Attending: Internal Medicine | Admitting: Internal Medicine

## 2021-02-25 ENCOUNTER — Other Ambulatory Visit: Payer: Self-pay

## 2021-02-25 DIAGNOSIS — Z20822 Contact with and (suspected) exposure to covid-19: Secondary | ICD-10-CM | POA: Diagnosis not present

## 2021-02-25 DIAGNOSIS — Z01812 Encounter for preprocedural laboratory examination: Secondary | ICD-10-CM | POA: Diagnosis present

## 2021-02-25 LAB — SARS CORONAVIRUS 2 (TAT 6-24 HRS): SARS Coronavirus 2: NEGATIVE

## 2021-02-27 ENCOUNTER — Ambulatory Visit
Admission: RE | Admit: 2021-02-27 | Discharge: 2021-02-27 | Disposition: A | Discharge status: Home / Self Care | Payer: Medicare Other | Attending: Internal Medicine | Admitting: Internal Medicine

## 2021-02-27 ENCOUNTER — Other Ambulatory Visit: Payer: Self-pay

## 2021-02-27 ENCOUNTER — Encounter
Admission: RE | Disposition: A | Discharge status: Home / Self Care | Payer: Self-pay | Source: Home / Self Care | Attending: Internal Medicine

## 2021-02-27 ENCOUNTER — Encounter: Payer: Self-pay | Admitting: Internal Medicine

## 2021-02-27 DIAGNOSIS — G4730 Sleep apnea, unspecified: Secondary | ICD-10-CM | POA: Diagnosis not present

## 2021-02-27 DIAGNOSIS — R079 Chest pain, unspecified: Secondary | ICD-10-CM | POA: Insufficient documentation

## 2021-02-27 DIAGNOSIS — E785 Hyperlipidemia, unspecified: Secondary | ICD-10-CM | POA: Insufficient documentation

## 2021-02-27 DIAGNOSIS — I10 Essential (primary) hypertension: Secondary | ICD-10-CM | POA: Diagnosis not present

## 2021-02-27 DIAGNOSIS — R0602 Shortness of breath: Secondary | ICD-10-CM | POA: Insufficient documentation

## 2021-02-27 DIAGNOSIS — I2510 Atherosclerotic heart disease of native coronary artery without angina pectoris: Secondary | ICD-10-CM | POA: Insufficient documentation

## 2021-02-27 HISTORY — PX: LEFT HEART CATH AND CORONARY ANGIOGRAPHY: CATH118249

## 2021-02-27 SURGERY — LEFT HEART CATH AND CORONARY ANGIOGRAPHY
Anesthesia: Moderate Sedation

## 2021-02-27 MED ORDER — SODIUM CHLORIDE 0.9% FLUSH: 3.0000 mL | Freq: Two times a day (BID) | INTRAVENOUS | Status: DC

## 2021-02-27 MED ORDER — IOHEXOL 300 MG/ML  SOLN
INTRAMUSCULAR | Status: DC | PRN
  Administered 2021-02-27: 110 mL

## 2021-02-27 MED ORDER — MIDAZOLAM HCL 2 MG/2ML IJ SOLN
INTRAMUSCULAR | Status: DC | PRN
  Administered 2021-02-27 (×2): 1 mg via INTRAVENOUS

## 2021-02-27 MED ORDER — SODIUM CHLORIDE 0.9 % WEIGHT BASED INFUSION
3.0000 mL/kg/h | INTRAVENOUS | Status: AC
  Administered 2021-02-27: 3 mL/kg/h via INTRAVENOUS

## 2021-02-27 MED ORDER — LIDOCAINE HCL (PF) 1 % IJ SOLN
INTRAMUSCULAR | Status: DC | PRN
  Administered 2021-02-27: 20 mL
  Administered 2021-02-27: 2 mL

## 2021-02-27 MED ORDER — HYDRALAZINE HCL 20 MG/ML IJ SOLN: 10.0000 mg | INTRAMUSCULAR | Status: DC | PRN

## 2021-02-27 MED ORDER — LABETALOL HCL 5 MG/ML IV SOLN: 10.0000 mg | INTRAVENOUS | Status: DC | PRN

## 2021-02-27 MED ORDER — MIDAZOLAM HCL 2 MG/2ML IJ SOLN
INTRAMUSCULAR | Status: AC
  Filled 2021-02-27: qty 2

## 2021-02-27 MED ORDER — VERAPAMIL HCL 2.5 MG/ML IV SOLN
INTRAVENOUS | Status: AC
  Filled 2021-02-27: qty 2

## 2021-02-27 MED ORDER — ACETAMINOPHEN 325 MG PO TABS: 650.0000 mg | ORAL_TABLET | ORAL | Status: DC | PRN

## 2021-02-27 MED ORDER — ASPIRIN 81 MG PO CHEW: 81.0000 mg | CHEWABLE_TABLET | ORAL | Status: DC

## 2021-02-27 MED ORDER — HEPARIN (PORCINE) IN NACL 1000-0.9 UT/500ML-% IV SOLN
INTRAVENOUS | Status: AC
  Filled 2021-02-27: qty 1000

## 2021-02-27 MED ORDER — SODIUM CHLORIDE 0.9 % WEIGHT BASED INFUSION: 1.0000 mL/kg/h | INTRAVENOUS | Status: DC

## 2021-02-27 MED ORDER — LIDOCAINE HCL (PF) 1 % IJ SOLN
INTRAMUSCULAR | Status: AC
  Filled 2021-02-27: qty 30

## 2021-02-27 MED ORDER — VERAPAMIL HCL 2.5 MG/ML IV SOLN
INTRAVENOUS | Status: DC | PRN
  Administered 2021-02-27: 2.5 mg via INTRA_ARTERIAL

## 2021-02-27 MED ORDER — NITROGLYCERIN 1 MG/10 ML FOR IR/CATH LAB
INTRA_ARTERIAL | Status: DC | PRN
  Administered 2021-02-27: 200 ug via INTRACORONARY

## 2021-02-27 MED ORDER — ONDANSETRON HCL 4 MG/2ML IJ SOLN: 4.0000 mg | Freq: Four times a day (QID) | INTRAMUSCULAR | Status: DC | PRN

## 2021-02-27 MED ORDER — SODIUM CHLORIDE 0.9% FLUSH: 3.0000 mL | INTRAVENOUS | Status: DC | PRN

## 2021-02-27 MED ORDER — FENTANYL CITRATE (PF) 100 MCG/2ML IJ SOLN
INTRAMUSCULAR | Status: DC | PRN
  Administered 2021-02-27: 25 ug via INTRAVENOUS
  Administered 2021-02-27: 50 ug via INTRAVENOUS

## 2021-02-27 MED ORDER — SODIUM CHLORIDE 0.9 % IV SOLN: 250.0000 mL | INTRAVENOUS | Status: DC | PRN

## 2021-02-27 MED ORDER — HEPARIN (PORCINE) IN NACL 1000-0.9 UT/500ML-% IV SOLN
INTRAVENOUS | Status: DC | PRN
  Administered 2021-02-27 (×2): 500 mL

## 2021-02-27 MED ORDER — HEPARIN SODIUM (PORCINE) 1000 UNIT/ML IJ SOLN
INTRAMUSCULAR | Status: AC
  Filled 2021-02-27: qty 1

## 2021-02-27 MED ORDER — FENTANYL CITRATE (PF) 100 MCG/2ML IJ SOLN
INTRAMUSCULAR | Status: AC
  Filled 2021-02-27: qty 2

## 2021-02-27 SURGICAL SUPPLY — 16 items
CATH INFINITI 5FR ANG PIGTAIL (CATHETERS) ×2 IMPLANT
CATH INFINITI 5FR JL4 (CATHETERS) ×2 IMPLANT
CATH INFINITI JR4 5F (CATHETERS) ×2 IMPLANT
COVER EZ STRL 42X30 (DRAPES) ×2 IMPLANT
DEVICE CLOSURE MYNXGRIP 5F (Vascular Products) ×2 IMPLANT
DEVICE RAD TR BAND REGULAR (VASCULAR PRODUCTS) ×2 IMPLANT
GLIDESHEATH SLEND SS 6F .021 (SHEATH) ×2 IMPLANT
NEEDLE PERC 18GX7CM (NEEDLE) ×2 IMPLANT
PACK CARDIAC CATH (CUSTOM PROCEDURE TRAY) ×2 IMPLANT
PROTECTION STATION PRESSURIZED (MISCELLANEOUS) ×2
SET ATX SIMPLICITY (MISCELLANEOUS) ×2 IMPLANT
SHEATH AVANTI 5FR X 11CM (SHEATH) ×2 IMPLANT
STATION PROTECTION PRESSURIZED (MISCELLANEOUS) ×1 IMPLANT
WIRE GUIDERIGHT .035X150 (WIRE) ×2 IMPLANT
WIRE HITORQ VERSACORE ST 145CM (WIRE) ×2 IMPLANT
WIRE ROSEN-J .035X260CM (WIRE) ×2 IMPLANT

## 2021-02-28 ENCOUNTER — Encounter: Payer: Self-pay | Admitting: Internal Medicine

## 2021-05-01 ENCOUNTER — Other Ambulatory Visit: Payer: Self-pay | Admitting: Family Medicine

## 2021-07-16 ENCOUNTER — Other Ambulatory Visit: Payer: Self-pay | Admitting: *Deleted

## 2021-07-16 DIAGNOSIS — R17 Unspecified jaundice: Secondary | ICD-10-CM

## 2021-07-16 DIAGNOSIS — M3131 Wegener's granulomatosis with renal involvement: Secondary | ICD-10-CM

## 2021-07-23 ENCOUNTER — Inpatient Hospital Stay: Payer: Medicare Other | Attending: Internal Medicine | Admitting: Internal Medicine

## 2021-07-23 ENCOUNTER — Inpatient Hospital Stay (HOSPITAL_BASED_OUTPATIENT_CLINIC_OR_DEPARTMENT_OTHER): Payer: Medicare Other | Admitting: Internal Medicine

## 2021-07-23 ENCOUNTER — Ambulatory Visit: Payer: Medicare Other

## 2021-07-23 ENCOUNTER — Other Ambulatory Visit: Payer: Self-pay

## 2021-07-23 VITALS — BP 174/67 | HR 71

## 2021-07-23 DIAGNOSIS — E079 Disorder of thyroid, unspecified: Secondary | ICD-10-CM | POA: Diagnosis not present

## 2021-07-23 DIAGNOSIS — Z85828 Personal history of other malignant neoplasm of skin: Secondary | ICD-10-CM | POA: Diagnosis not present

## 2021-07-23 DIAGNOSIS — M3131 Wegener's granulomatosis with renal involvement: Secondary | ICD-10-CM

## 2021-07-23 DIAGNOSIS — Z79899 Other long term (current) drug therapy: Secondary | ICD-10-CM | POA: Diagnosis not present

## 2021-07-23 DIAGNOSIS — R972 Elevated prostate specific antigen [PSA]: Secondary | ICD-10-CM | POA: Diagnosis not present

## 2021-07-23 DIAGNOSIS — Z5112 Encounter for antineoplastic immunotherapy: Secondary | ICD-10-CM | POA: Diagnosis not present

## 2021-07-23 DIAGNOSIS — N058 Unspecified nephritic syndrome with other morphologic changes: Secondary | ICD-10-CM | POA: Diagnosis not present

## 2021-07-23 DIAGNOSIS — I776 Arteritis, unspecified: Secondary | ICD-10-CM

## 2021-07-23 DIAGNOSIS — R17 Unspecified jaundice: Secondary | ICD-10-CM

## 2021-07-23 DIAGNOSIS — I10 Essential (primary) hypertension: Secondary | ICD-10-CM | POA: Insufficient documentation

## 2021-07-23 DIAGNOSIS — M3130 Wegener's granulomatosis without renal involvement: Secondary | ICD-10-CM | POA: Insufficient documentation

## 2021-07-23 LAB — COMPREHENSIVE METABOLIC PANEL
ALT: 33 U/L (ref 0–44)
AST: 25 U/L (ref 15–41)
Albumin: 4.1 g/dL (ref 3.5–5.0)
Alkaline Phosphatase: 55 U/L (ref 38–126)
Anion gap: 5 (ref 5–15)
BUN: 24 mg/dL — ABNORMAL HIGH (ref 8–23)
CO2: 31 mmol/L (ref 22–32)
Calcium: 9.1 mg/dL (ref 8.9–10.3)
Chloride: 96 mmol/L — ABNORMAL LOW (ref 98–111)
Creatinine, Ser: 1.05 mg/dL (ref 0.61–1.24)
GFR, Estimated: >60 mL/min (ref >60)
Glucose, Bld: 84 mg/dL (ref 70–99)
Potassium: 3.9 mmol/L (ref 3.5–5.1)
Sodium: 132 mmol/L — ABNORMAL LOW (ref 135–145)
Total Bilirubin: 0.9 mg/dL (ref 0.3–1.2)
Total Protein: 7 g/dL (ref 6.5–8.1)

## 2021-07-23 LAB — CBC WITH DIFFERENTIAL/PLATELET
Abs Immature Granulocytes: 0.04 10*3/uL (ref 0.00–0.07)
Basophils Absolute: 0.1 10*3/uL (ref 0.0–0.1)
Basophils Relative: 1 %
Eosinophils Absolute: 0.1 10*3/uL (ref 0.0–0.5)
Eosinophils Relative: 1 %
HCT: 38.6 % — ABNORMAL LOW (ref 39.0–52.0)
Hemoglobin: 13.7 g/dL (ref 13.0–17.0)
Immature Granulocytes: 1 %
Lymphocytes Relative: 17 %
Lymphs Abs: 1.2 10*3/uL (ref 0.7–4.0)
MCH: 31.6 pg (ref 26.0–34.0)
MCHC: 35.5 g/dL (ref 30.0–36.0)
MCV: 88.9 fL (ref 80.0–100.0)
Monocytes Absolute: 0.8 10*3/uL (ref 0.1–1.0)
Monocytes Relative: 12 %
Neutro Abs: 4.7 10*3/uL (ref 1.7–7.7)
Neutrophils Relative %: 68 %
Platelets: 229 10*3/uL (ref 150–400)
RBC: 4.34 MIL/uL (ref 4.22–5.81)
RDW: 13.1 % (ref 11.5–15.5)
WBC: 6.8 10*3/uL (ref 4.0–10.5)
nRBC: 0 % (ref 0.0–0.2)

## 2021-07-23 MED ORDER — METHYLPREDNISOLONE SODIUM SUCC 125 MG IJ SOLR: 25.0000 mg | Freq: Once | INTRAMUSCULAR | Status: DC

## 2021-07-23 MED ORDER — RITUXIMAB-PVVR CHEMO 500 MG/50ML IV SOLN
1000.0000 mg | Freq: Once | INTRAVENOUS | Status: AC
  Administered 2021-07-23: 1000 mg via INTRAVENOUS
  Filled 2021-07-23: qty 100

## 2021-07-23 MED ORDER — SODIUM CHLORIDE 0.9 % IV SOLN
Freq: Once | INTRAVENOUS | Status: AC
  Filled 2021-07-23: qty 250

## 2021-07-23 MED ORDER — DIPHENHYDRAMINE HCL 25 MG PO CAPS
50.0000 mg | ORAL_CAPSULE | Freq: Once | ORAL | Status: AC
  Administered 2021-07-23: 50 mg via ORAL
  Filled 2021-07-23: qty 2

## 2021-07-23 MED ORDER — ACETAMINOPHEN 325 MG PO TABS
650.0000 mg | ORAL_TABLET | Freq: Once | ORAL | Status: AC
  Administered 2021-07-23: 650 mg via ORAL
  Filled 2021-07-23: qty 2

## 2021-07-23 MED ORDER — METHYLPREDNISOLONE SODIUM SUCC 125 MG IJ SOLR
INTRAMUSCULAR | Status: AC
  Administered 2021-07-23: 25 mg
  Filled 2021-07-23: qty 2

## 2021-07-23 MED ORDER — METHYLPREDNISOLONE SODIUM SUCC 40 MG IJ SOLR
25.0000 mg | Freq: Once | INTRAMUSCULAR | Status: DC
  Filled 2021-07-23: qty 1

## 2021-07-23 NOTE — Assessment & Plan Note (Addendum)
#    Wegner's granulomatosisdiagnosed in 12/2017 [renal/pulmonary]; Serologywaspositive PR-3 ANCA.  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  [Dr.Saha; [facility]]  #On rituximab every 6 months also.  As per Dr. Blenda Peals every 5 months [as patient feels symptoms coming on].  # ?  Reaction to Solu-Medrol-elevated blood pressure [feb 2022]-decrease the dose of Solu-Medrol to 25 premedication.   He has an elevated PSA.  PSA has been followed:  3.85 on 09/16/2018, 4.95 on 12/07/2018, 4.74 on 12/22/2019, and 5.86 on 12/21/2020.  He is followed by Dr Bernardo Heater.  #  Wegener's granulomatosis:  Rituxan 1000 mg IV today.  Premeds: Benadryl 50 mg p.o., Tylenol 650 mg p.o. and Solu-Medrol 25 mg IV.  # DISPOSITION: # treatment today # follow up in 5 months- MD; labs- cbc/cmp;[facility]- Dr.B

## 2021-07-23 NOTE — Progress Notes (Signed)
[facility] OFFICE PROGRESS NOTE  Patient Care Team: Gladstone Lighter, MD as PCP - General (Internal Medicine)  Cancer Staging No matching staging information was found for the patient.   Oncology History   No history exists.  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  There was moderate arteriosclerosis with mild interstitial fibrosis and tubular atrophy.   [Dr.Saha; [facility]]  #Rituximab every 5 to 6 months [Dr. [facility]le]; no steroids    INTERVAL HISTORY: Ambulating independently/wife  Logan Murphy 75 y.o.  male pleasant patient above history of renal/pulmonary diagnosed granulomatosis currently on rituximab is here for follow-up.  Patient complains of mild tingling and numbness in his feet not any worse.  Poor tolerance to Cymbalta/gabapentin.  Otherwise no nausea no vomiting.  No skin rash.  No shortness of breath or cough.  Review of Systems  Constitutional:  Positive for malaise/fatigue. Negative for chills, diaphoresis, fever and weight loss.  HENT:  Negative for nosebleeds and sore throat.   Eyes:  Negative for double vision.  Respiratory:  Negative for cough, hemoptysis, sputum production, shortness of breath and wheezing.   Cardiovascular:  Negative for chest pain, palpitations, orthopnea and leg swelling.  Gastrointestinal:  Negative for abdominal pain, blood in stool, constipation, diarrhea, heartburn, melena, nausea and vomiting.  Genitourinary:  Negative for dysuria, frequency and urgency.  Musculoskeletal:  Negative for back pain and joint pain.  Skin: Negative.  Negative for itching and rash.  Neurological:  Positive for tingling. Negative for dizziness, focal weakness, weakness and headaches.  Endo/Heme/Allergies:  Does not bruise/bleed easily.   Psychiatric/Behavioral:  Negative for depression. The patient is not nervous/anxious and does not have insomnia.      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)    Chronic pain    Gout    Gout    Hypertension    Skin cancer    Thyroid disease     PAST SURGICAL HISTORY :   Past Surgical History:  Procedure Laterality Date   CHOLECYSTECTOMY     GALLBLADDER SURGERY     LEFT HEART CATH AND CORONARY ANGIOGRAPHY N/A 02/27/2021   Procedure: LEFT HEART CATH AND CORONARY ANGIOGRAPHY;  Surgeon: Corey Skains, MD;  Location: [facility];  Service: Cardiovascular;  Laterality: N/A;   TOTAL HIP ARTHROPLASTY Right    VIDEO BRONCHOSCOPY Bilateral 01/13/2018   Procedure: VIDEO BRONCHOSCOPY WITH FLUORO;  Surgeon: [name], MD;  Location: [facility];  Service: Cardiopulmonary;  Laterality: Bilateral;    FAMILY HISTORY :   Family History  Problem Relation Age of Onset   Hypertension Other     SOCIAL HISTORY:   Social History   Tobacco Use   Smoking status: Never   Smokeless tobacco: Never  Vaping Use   Vaping Use: Never used  Substance Use Topics   Alcohol use: Yes   Drug use: No    ALLERGIES:  is allergic to fire ant, duloxetine, and gabapentin.  MEDICATIONS:  Current Outpatient Medications  Medication Sig Dispense Refill   allopurinol (ZYLOPRIM) 300 MG tablet Take 300 mg by mouth daily.     Alpha Lipoic Acid 200 MG CAPS Take 200 mg by mouth 2 (two) times daily.     amLODipine (NORVASC) 5 MG tablet Take 5 mg by  mouth daily.     Calcium Carb-Cholecalciferol (CALCIUM 600+D) 600-800 MG-UNIT TABS Take 1 tablet by mouth daily.     Carboxymethylcellul-Glycerin (REFRESH RELIEVA OP) Place 1 drop into both eyes 2 (two) times daily as needed (dry eyes).     cetirizine (ZYRTEC) 10 MG tablet Take 10 mg by mouth daily as needed for allergies.     Coenzyme Q10 (COQ-10) 100 MG CAPS Take  100 mg by mouth every evening.     fluticasone (FLONASE) 50 MCG/ACT nasal spray Place 2 sprays into both nostrils 2 (two) times daily.     hydrochlorothiazide (HYDRODIURIL) 25 MG tablet Take 1 tablet (25 mg total) by mouth daily. 90 tablet 0   levothyroxine (SYNTHROID) 100 MCG tablet Take 100 mcg by mouth daily before breakfast.     lisinopril (PRINIVIL,ZESTRIL) 40 MG tablet Take 40 mg by mouth every evening.      metoprolol succinate (TOPROL-XL) 25 MG 24 hr tablet Take 25 mg by mouth daily.     Multiple Vitamin (MULTIVITAMIN WITH MINERALS) TABS tablet Take 1 tablet by mouth daily.     pravastatin (PRAVACHOL) 20 MG tablet Take 20 mg by mouth daily.     tamsulosin (FLOMAX) 0.4 MG CAPS capsule Take 0.4 mg by mouth daily after supper.      EPINEPHrine 0.3 mg/0.3 mL IJ SOAJ injection Inject 0.3 mg into the muscle as needed for anaphylaxis.     Magnesium 250 MG TABS Take 250 mg by mouth daily.     No current facility-administered medications for this visit.   Facility-Administered Medications Ordered in Other Visits  Medication Dose Route Frequency Provider Last Rate Last Admin   acetaminophen (TYLENOL) tablet 650 mg  650 mg Oral Once Lequita Asal, MD       diphenhydrAMINE (BENADRYL) capsule 50 mg  50 mg Oral Once Lequita Asal, MD       methylPREDNISolone sodium succinate (SOLU-MEDROL) 125 mg/2 mL injection 25 mg  25 mg Intravenous Once Charlaine Dalton R, MD       methylPREDNISolone sodium succinate (SOLU-MEDROL) 125 mg/2 mL injection 25 mg  25 mg Intravenous Once Cammie Sickle, MD       riTUXimab-pvvr (RUXIENCE) 1,000 mg in sodium chloride 0.9 % 250 mL (2.8571 mg/mL) infusion  1,000 mg Intravenous Once Nolon Stalls C, MD        PHYSICAL EXAMINATION:  BP (!) 147/79   Pulse (!) 57   Resp 18   Wt 247 lb 2.2 oz (112.1 kg)   BMI 32.61 kg/m   Filed Weights   07/23/21 0926  Weight: 247 lb 2.2 oz (112.1 kg)    Physical Exam Vitals and nursing note reviewed.   HENT:     Head: Normocephalic and atraumatic.     Mouth/Throat:     Pharynx: Oropharynx is clear.  Eyes:     Extraocular Movements: Extraocular movements intact.     Pupils: Pupils are equal, round, and reactive to light.  Cardiovascular:     Rate and Rhythm: Normal rate and regular rhythm.  Pulmonary:     Comments: Decreased breath sounds bilaterally.  Abdominal:     Palpations: Abdomen is soft.  Musculoskeletal:        General: Normal range of motion.     Cervical back: Normal range of motion.  Skin:    General: Skin is warm.  Neurological:     General: No focal deficit present.     Mental Status: He is alert and oriented to  person, place, and time.  Psychiatric:        Behavior: Behavior normal.        Judgment: Judgment normal.       LABORATORY DATA:  I have reviewed the data as listed    Component Value Date/Time   NA 132 (L) 07/23/2021 0917   K 3.9 07/23/2021 0917   CL 96 (L) 07/23/2021 0917   CO2 31 07/23/2021 0917   GLUCOSE 84 07/23/2021 0917   BUN 24 (H) 07/23/2021 0917   CREATININE 1.05 07/23/2021 0917   CALCIUM 9.1 07/23/2021 0917   PROT 7.0 07/23/2021 0917   ALBUMIN 4.1 07/23/2021 0917   AST 25 07/23/2021 0917   ALT 33 07/23/2021 0917   ALKPHOS 55 07/23/2021 0917   BILITOT 0.9 07/23/2021 0917   GFRNONAA >60 07/23/2021 0917   GFRAA >60 08/06/2020 0917    No results found for: SPEP, UPEP  Lab Results  Component Value Date   WBC 6.8 07/23/2021   NEUTROABS 4.7 07/23/2021   HGB 13.7 07/23/2021   HCT 38.6 (L) 07/23/2021   MCV 88.9 07/23/2021   PLT 229 07/23/2021      Chemistry      Component Value Date/Time   NA 132 (L) 07/23/2021 0917   K 3.9 07/23/2021 0917   CL 96 (L) 07/23/2021 0917   CO2 31 07/23/2021 0917   BUN 24 (H) 07/23/2021 0917   CREATININE 1.05 07/23/2021 0917      Component Value Date/Time   CALCIUM 9.1 07/23/2021 0917   ALKPHOS 55 07/23/2021 0917   AST 25 07/23/2021 0917   ALT 33 07/23/2021 0917   BILITOT 0.9  07/23/2021 0917       RADIOGRAPHIC STUDIES: I have personally reviewed the radiological images as listed and agreed with the findings in the report. No results found.   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  There was moderate arteriosclerosis with mild interstitial fibrosis and tubular atrophy.   [Dr.Saha; [facility]]  #On rituximab every 6 months also.  As per Dr. Blenda Peals every 5 months [as patient feels symptoms coming on].  # ?  Reaction to Solu-Medrol-elevated blood pressure [feb 2022]-decrease the dose of Solu-Medrol to 25 premedication.    He has an elevated PSA.  PSA has been followed:  3.85 on 09/16/2018, 4.95 on 12/07/2018, 4.74 on 12/22/2019, and 5.86 on 12/21/2020.  He is followed by Dr Bernardo Heater.   #  Wegener's granulomatosis:  Rituxan 1000 mg IV today.  Premeds: Benadryl 50 mg p.o., Tylenol 650 mg p.o. and Solu-Medrol 25 mg IV.  # DISPOSITION: # treatment today # follow up in 5 months- MD; labs- cbc/cmp;LDH- Dr.B      Orders Placed This Encounter  Procedures   CBC with Differential/Platelet    Standing Status:   Future    Standing Expiration Date:   07/23/2022   Comprehensive metabolic panel    Standing Status:   Future    Standing Expiration Date:   07/23/2022   Lactate dehydrogenase    Standing Status:   Future    Standing Expiration Date:   07/23/2022   All questions were answered. The patient knows to call the clinic with any problems, questions or concerns.      Cammie Sickle, MD 07/23/2021 12:48 PM

## 2021-07-23 NOTE — Patient Instructions (Signed)
[facility]  Discharge Instructions: Thank you for choosing [facility] to provide your oncology and hematology care.  If you have a lab appointment with the Cancer Center, please go directly to the Cancer Center and check in at the registration area.  Wear comfortable clothing and clothing appropriate for easy access to any Portacath or PICC line.   We strive to give you quality time with your provider. You may need to reschedule your appointment if you arrive late (15 or more minutes).  Arriving late affects you and other patients whose appointments are after yours.  Also, if you miss three or more appointments without notifying the office, you may be dismissed from the clinic at the provider's discretion.      For prescription refill requests, have your pharmacy contact our office and allow 72 hours for refills to be completed.    Today you received the following chemotherapy and/or immunotherapy agents       To help prevent nausea and vomiting after your treatment, we encourage you to take your nausea medication as directed.  BELOW ARE SYMPTOMS THAT SHOULD BE REPORTED IMMEDIATELY: *FEVER GREATER THAN 100.4 F (38 C) OR HIGHER *CHILLS OR SWEATING *NAUSEA AND VOMITING THAT IS NOT CONTROLLED WITH YOUR NAUSEA MEDICATION *UNUSUAL SHORTNESS OF BREATH *UNUSUAL BRUISING OR BLEEDING *URINARY PROBLEMS (pain or burning when urinating, or frequent urination) *BOWEL PROBLEMS (unusual diarrhea, constipation, pain near the anus) TENDERNESS IN MOUTH AND THROAT WITH OR WITHOUT PRESENCE OF ULCERS (sore throat, sores in mouth, or a toothache) UNUSUAL RASH, SWELLING OR PAIN  UNUSUAL VAGINAL DISCHARGE OR ITCHING   Items with * indicate a potential emergency and should be followed up as soon as possible or go to the Emergency Department if any problems should occur.  Please show the CHEMOTHERAPY ALERT CARD or IMMUNOTHERAPY ALERT CARD at check-in to the Emergency  Department and triage nurse.  Should you have questions after your visit or need to cancel or reschedule your appointment, please contact [facility]  [phone] and follow the prompts.  Office hours are 8:00 a.m. to 4:30 p.m. Monday - Friday. Please note that voicemails left after 4:00 p.m. may not be returned until the following business day.  We are closed weekends and major holidays. You have access to a nurse at all times for urgent questions. Please call the main number to the clinic [phone] and follow the prompts.  For any non-urgent questions, you may also contact your provider using MyChart. We now offer e-Visits for anyone 18 and older to request care online for non-urgent symptoms. For details visit [email or web address].   Also download the MyChart app! Go to the app store, search "MyChart", open the app, select [facility], and log in with your MyChart username and password.  Due to Covid, a mask is required upon entering the hospital/clinic. If you do not have a mask, one will be given to you upon arrival. For doctor visits, patients may have 1 support person aged 18 or older with them. For treatment visits, patients cannot have anyone with them due to current Covid guidelines and our immunocompromised population.  

## 2021-07-31 ENCOUNTER — Ambulatory Visit (INDEPENDENT_AMBULATORY_CARE_PROVIDER_SITE_OTHER): Payer: Medicare Other | Admitting: Urology

## 2021-07-31 ENCOUNTER — Other Ambulatory Visit: Payer: Self-pay

## 2021-07-31 ENCOUNTER — Encounter: Payer: Self-pay | Admitting: Urology

## 2021-07-31 ENCOUNTER — Encounter: Payer: Self-pay | Admitting: Oncology

## 2021-07-31 VITALS — BP 143/68 | HR 64 | Ht 73.0 in | Wt 245.0 lb

## 2021-07-31 DIAGNOSIS — N401 Enlarged prostate with lower urinary tract symptoms: Secondary | ICD-10-CM

## 2021-07-31 DIAGNOSIS — R972 Elevated prostate specific antigen [PSA]: Secondary | ICD-10-CM

## 2021-07-31 NOTE — Progress Notes (Signed)
07/31/2021 11:14 AM   Logan Murphy 11-19-1945 [id number]  Referring provider: Gladstone Lighter, MD [place],  [place] [address]  Chief Complaint  Patient presents with   Elevated PSA     Urologic history: 1.  Elevated PSA Initially seen 12/2020 PSA 5.86 Elected surveillance  2.  BPH with LUTS Tamsulosin daily   HPI: 75 y.o. male presents for 79-monthfollow-up.  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)    Chronic pain    Gout    Gout    Hypertension    Skin cancer    Thyroid disease     Surgical History: Past Surgical History:  Procedure Laterality Date   CHOLECYSTECTOMY     GALLBLADDER SURGERY     LEFT HEART CATH AND CORONARY ANGIOGRAPHY N/A 02/27/2021   Procedure: LEFT HEART CATH AND CORONARY ANGIOGRAPHY;  Surgeon: KCorey Skains MD;  [facility];  Service: Cardiovascular;  Laterality: N/A;   TOTAL HIP ARTHROPLASTY Right    VIDEO BRONCHOSCOPY Bilateral 01/13/2018   Procedure: VIDEO BRONCHOSCOPY WITH FLUORO;  Surgeon: RLaverle Hobby MD;  Location: [facility];  Service: Cardiopulmonary;  Laterality: Bilateral;    Home Medications:  Allergies as of 07/31/2021       Reactions   Fire Ant Anaphylaxis   Duloxetine Other (See Comments)   Gave him weird dreams   Gabapentin    Confusion         Medication List        Accurate as of July 31, 2021 11:14 AM. If you have any questions, ask your nurse or doctor.          allopurinol 300 MG tablet Commonly known as: ZYLOPRIM Take 300 mg by mouth daily.   Alpha Lipoic Acid 200 MG Caps Take 200 mg by mouth 2 (two) times daily.   amLODipine 5 MG tablet Commonly known as: NORVASC Take 5 mg by mouth daily.   Calcium 600+D 600-800 MG-UNIT  Tabs Generic drug: Calcium Carb-Cholecalciferol Take 1 tablet by mouth daily.   cetirizine 10 MG tablet Commonly known as: ZYRTEC Take 10 mg by mouth daily as needed for allergies.   CoQ-10 100 MG Caps Take 100 mg by mouth every evening.   EPINEPHrine 0.3 mg/0.3 mL Soaj injection Commonly known as: EPI-PEN Inject 0.3 mg into the muscle as needed for anaphylaxis.   fluticasone 50 MCG/ACT nasal spray Commonly known as: FLONASE Place 2 sprays into both nostrils 2 (two) times daily.   hydrochlorothiazide 25 MG tablet Commonly known as: HYDRODIURIL Take 1 tablet (25 mg total) by mouth daily.   levothyroxine 100 MCG tablet Commonly known as: SYNTHROID Take 100 mcg by mouth daily before breakfast.   lisinopril 40 MG tablet Commonly known as: ZESTRIL Take 40 mg by mouth every evening.   Magnesium 250 MG Tabs Take 250 mg by mouth daily.   metoprolol succinate 25 MG 24 hr tablet Commonly known as: TOPROL-XL Take 25 mg by mouth daily.   multivitamin with minerals Tabs tablet Take 1 tablet by mouth daily.   pravastatin 20 MG tablet Commonly known as: PRAVACHOL Take 20 mg by mouth daily.   REFRESH RELIEVA OP Place 1 drop into both eyes 2 (two) times daily as needed (dry eyes).   tamsulosin 0.4  MG Caps capsule Commonly known as: FLOMAX Take 0.4 mg by mouth daily after supper.        Allergies:  Allergies  Allergen Reactions   Fire Ant Anaphylaxis   Duloxetine Other (See Comments)    Gave him weird dreams   Gabapentin     Confusion     Family History: Family History  Problem Relation Age of Onset   Hypertension Other     Social History:  reports that he has never smoked. He has never used smokeless tobacco. He reports current alcohol use. He reports that he does not use drugs.   Physical Exam: BP (!) 143/68   Pulse 64   Ht '6\' 1"'$  (1.854 m)   Wt 245 lb (111.1 kg)   BMI 32.32 kg/m   Constitutional:  Alert and oriented, No acute distress. HEENT: [place] AT,  moist mucus membranes.  Trachea midline, no masses. Cardiovascular: No clubbing, cyanosis, or edema. Respiratory: Normal respiratory effort, no increased work of breathing.   Assessment & Plan:    1.  Elevated PSA PSA back to baseline levels He desires to continue surveillance Follow-up 6 months PSA/DRE and if stable will move to annual visits  2.  BPH with LUTS No bothersome LUTS on tamsulosin   Abbie Sons, MD  [facility] [address] [place], [place] [address] [phone]

## 2021-08-01 ENCOUNTER — Encounter: Payer: Self-pay | Admitting: Urology

## 2021-11-19 ENCOUNTER — Other Ambulatory Visit: Payer: Self-pay | Admitting: Student

## 2021-11-19 DIAGNOSIS — I7121 Aneurysm of the ascending aorta, without rupture: Secondary | ICD-10-CM

## 2021-12-16 ENCOUNTER — Ambulatory Visit: Payer: Medicare Other

## 2021-12-18 ENCOUNTER — Telehealth: Payer: Self-pay | Admitting: *Deleted

## 2021-12-18 NOTE — Telephone Encounter (Signed)
Patient called reporting that he was diagnosed with COVID on 12/12/21, He is feeling fine and his 5 days of confinement are up. He has an appointment for infusion on 2/7 and he wants to be sure that he can still come for this appointment. He prefers not to reschedule it if he does not have to. Please advise if he can keep this appointment.

## 2021-12-19 ENCOUNTER — Encounter: Payer: Self-pay | Admitting: Oncology

## 2021-12-19 ENCOUNTER — Telehealth: Payer: Self-pay | Admitting: Internal Medicine

## 2021-12-19 NOTE — Telephone Encounter (Signed)
Patient was receiving non-oncology infusions at the [facility] which is now closed.  Patient will need to f/u with the treating MD, Dr. Jefm Bryant, regarding future infusions.  Patient has been notified by scheduling.

## 2021-12-19 NOTE — Telephone Encounter (Signed)
Pt called about his infusion. Call back at [phone]

## 2021-12-23 ENCOUNTER — Ambulatory Visit: Payer: TRICARE For Life (TFL) | Admitting: Internal Medicine

## 2021-12-23 ENCOUNTER — Other Ambulatory Visit: Payer: TRICARE For Life (TFL)

## 2021-12-23 MED ORDER — SODIUM CHLORIDE 0.9 % IV SOLN: 1000.0000 mg | Freq: Once | INTRAVENOUS | Status: DC

## 2021-12-24 ENCOUNTER — Ambulatory Visit
Admission: RE | Admit: 2021-12-24 | Discharge: 2021-12-24 | Disposition: A | Discharge status: Home / Self Care | Payer: Medicare Other | Source: Ambulatory Visit | Attending: Rheumatology | Admitting: Rheumatology

## 2021-12-24 ENCOUNTER — Other Ambulatory Visit: Payer: TRICARE For Life (TFL)

## 2021-12-24 ENCOUNTER — Ambulatory Visit: Payer: TRICARE For Life (TFL) | Admitting: Internal Medicine

## 2021-12-24 ENCOUNTER — Other Ambulatory Visit: Payer: Self-pay

## 2021-12-24 VITALS — BP 136/68 | HR 64 | Temp 96.6°F | Resp 16

## 2021-12-24 DIAGNOSIS — M3131 Wegener's granulomatosis with renal involvement: Secondary | ICD-10-CM

## 2021-12-24 DIAGNOSIS — M3130 Wegener's granulomatosis without renal involvement: Secondary | ICD-10-CM | POA: Diagnosis present

## 2021-12-24 DIAGNOSIS — I776 Arteritis, unspecified: Secondary | ICD-10-CM | POA: Diagnosis not present

## 2021-12-24 LAB — CBC WITH DIFFERENTIAL/PLATELET
Abs Immature Granulocytes: 0.06 10*3/uL (ref 0.00–0.07)
Basophils Absolute: 0.1 10*3/uL (ref 0.0–0.1)
Basophils Relative: 1 %
Eosinophils Absolute: 0.2 10*3/uL (ref 0.0–0.5)
Eosinophils Relative: 2 %
HCT: 42.8 % (ref 39.0–52.0)
Hemoglobin: 14.9 g/dL (ref 13.0–17.0)
Immature Granulocytes: 1 %
Lymphocytes Relative: 14 %
Lymphs Abs: 1 10*3/uL (ref 0.7–4.0)
MCH: 31.4 pg (ref 26.0–34.0)
MCHC: 34.8 g/dL (ref 30.0–36.0)
MCV: 90.3 fL (ref 80.0–100.0)
Monocytes Absolute: 0.8 10*3/uL (ref 0.1–1.0)
Monocytes Relative: 12 %
Neutro Abs: 4.8 10*3/uL (ref 1.7–7.7)
Neutrophils Relative %: 70 %
Platelets: 222 10*3/uL (ref 150–400)
RBC: 4.74 MIL/uL (ref 4.22–5.81)
RDW: 13.2 % (ref 11.5–15.5)
WBC: 6.8 10*3/uL (ref 4.0–10.5)
nRBC: 0 % (ref 0.0–0.2)

## 2021-12-24 LAB — COMPREHENSIVE METABOLIC PANEL
ALT: 29 U/L (ref 0–44)
AST: 27 U/L (ref 15–41)
Albumin: 4 g/dL (ref 3.5–5.0)
Alkaline Phosphatase: 55 U/L (ref 38–126)
Anion gap: 7 (ref 5–15)
BUN: 19 mg/dL (ref 8–23)
CO2: 27 mmol/L (ref 22–32)
Calcium: 9 mg/dL (ref 8.9–10.3)
Chloride: 98 mmol/L (ref 98–111)
Creatinine, Ser: 0.99 mg/dL (ref 0.61–1.24)
GFR, Estimated: >60 mL/min (ref >60)
Glucose, Bld: 97 mg/dL (ref 70–99)
Potassium: 3.7 mmol/L (ref 3.5–5.1)
Sodium: 132 mmol/L — ABNORMAL LOW (ref 135–145)
Total Bilirubin: 1 mg/dL (ref 0.3–1.2)
Total Protein: 7.3 g/dL (ref 6.5–8.1)

## 2021-12-24 LAB — LACTATE DEHYDROGENASE: LDH: 129 U/L (ref 98–192)

## 2021-12-24 MED ORDER — SODIUM CHLORIDE 0.9 % IV SOLN
1000.0000 mg | Freq: Once | INTRAVENOUS | Status: AC
  Administered 2021-12-24: 1000 mg via INTRAVENOUS
  Filled 2021-12-24: qty 100

## 2021-12-24 MED ORDER — DIPHENHYDRAMINE HCL 25 MG PO CAPS
ORAL_CAPSULE | ORAL | Status: AC
  Administered 2021-12-24: 50 mg via ORAL
  Filled 2021-12-24: qty 1

## 2021-12-24 MED ORDER — METHYLPREDNISOLONE SODIUM SUCC 40 MG IJ SOLR
25.0000 mg | Freq: Once | INTRAMUSCULAR | Status: AC
  Administered 2021-12-24: 25 mg via INTRAVENOUS
  Filled 2021-12-24: qty 1

## 2021-12-24 MED ORDER — ACETAMINOPHEN 325 MG PO TABS
ORAL_TABLET | ORAL | Status: AC
  Administered 2021-12-24: 650 mg
  Filled 2021-12-24: qty 2

## 2021-12-24 MED ORDER — DIPHENHYDRAMINE HCL 25 MG PO CAPS
ORAL_CAPSULE | ORAL | Status: AC
  Filled 2021-12-24: qty 1

## 2021-12-24 MED ORDER — DIPHENHYDRAMINE HCL 25 MG PO CAPS: 50.0000 mg | ORAL_CAPSULE | Freq: Once | ORAL | Status: AC

## 2021-12-24 MED ORDER — SODIUM CHLORIDE 0.9 % IV SOLN: 1000.0000 mg | Freq: Once | INTRAVENOUS | Status: DC

## 2022-01-03 ENCOUNTER — Ambulatory Visit
Admission: RE | Admit: 2022-01-03 | Discharge: 2022-01-03 | Disposition: A | Discharge status: Home / Self Care | Payer: Medicare Other | Source: Ambulatory Visit | Attending: Student | Admitting: Student

## 2022-01-03 ENCOUNTER — Other Ambulatory Visit: Payer: Self-pay

## 2022-01-03 DIAGNOSIS — I7121 Aneurysm of the ascending aorta, without rupture: Secondary | ICD-10-CM | POA: Insufficient documentation

## 2022-01-03 MED ORDER — IOHEXOL 350 MG/ML SOLN
75.0000 mL | Freq: Once | INTRAVENOUS | Status: AC | PRN
  Administered 2022-01-03: 75 mL via INTRAVENOUS

## 2022-01-07 ENCOUNTER — Other Ambulatory Visit: Payer: Self-pay | Admitting: *Deleted

## 2022-01-24 ENCOUNTER — Other Ambulatory Visit: Payer: Medicare Other

## 2022-01-24 ENCOUNTER — Other Ambulatory Visit: Payer: Self-pay

## 2022-01-24 DIAGNOSIS — R972 Elevated prostate specific antigen [PSA]: Secondary | ICD-10-CM

## 2022-01-25 LAB — PSA: Prostate Specific Ag, Serum: 5.5 ng/mL — ABNORMAL HIGH (ref 0.0–4.0)

## 2022-01-29 ENCOUNTER — Ambulatory Visit (INDEPENDENT_AMBULATORY_CARE_PROVIDER_SITE_OTHER): Payer: Medicare Other | Admitting: Urology

## 2022-01-29 ENCOUNTER — Encounter: Payer: Self-pay | Admitting: Urology

## 2022-01-29 ENCOUNTER — Other Ambulatory Visit: Payer: Self-pay

## 2022-01-29 VITALS — BP 130/70 | HR 74 | Ht 73.0 in | Wt 245.0 lb

## 2022-01-29 DIAGNOSIS — R972 Elevated prostate specific antigen [PSA]: Secondary | ICD-10-CM

## 2022-01-29 DIAGNOSIS — N401 Enlarged prostate with lower urinary tract symptoms: Secondary | ICD-10-CM | POA: Diagnosis not present

## 2022-01-29 NOTE — Progress Notes (Signed)
? ?01/29/2022 ?11:16 AM  ? ?Logan Murphy ?November 13, 1946 ?[id number] ? ?Referring provider: Gladstone Lighter, MD ?[facility] ?[place],  [place] [address] ? ?Chief Complaint  ?Patient presents with  ? Elevated PSA  ? ? ? ?Urologic history: ?1.  Elevated PSA ?Initially seen 12/2020 PSA 5.86 ?Elected surveillance ? ?2.  BPH with LUTS ?Tamsulosin daily ? ? ?HPI: ?76 y.o. male presents for 39-monthfollow-up. ? ?Doing well since last visit ?No bothersome LUTS ?Denies dysuria, gross hematuria ?Denies flank, abdominal or pelvic pain ?PSA 01/24/2022 5.5; 06/11/2021 4.87 ?Remains on tamsulosin 0.4 mg daily ? ?PMH: ?Past Medical History:  ?Diagnosis Date  ? 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 Chronic pain   ? Gout   ? Gout   ? Hypertension   ? Skin cancer   ? Thyroid disease   ? ? ?Surgical History: ?Past Surgical History:  ?Procedure Laterality Date  ? CHOLECYSTECTOMY    ? GALLBLADDER SURGERY    ? LEFT HEART CATH AND CORONARY ANGIOGRAPHY N/A 02/27/2021  ? Procedure: LEFT HEART CATH AND CORONARY ANGIOGRAPHY;  Surgeon: KCorey Skains MD;  [facility];  Service: Cardiovascular;  Laterality: N/A;  ? TOTAL HIP ARTHROPLASTY Right   ? VIDEO BRONCHOSCOPY Bilateral 01/13/2018  ? Procedure: VIDEO BRONCHOSCOPY WITH FLUORO;  Surgeon: RLaverle Hobby MD;  Location: [facility];  Service: Cardiopulmonary;  Laterality: Bilateral;  ? ? ?Home Medications:  ?Allergies as of 01/29/2022   ? ?   Reactions  ? Fire ADynegyAnaphylaxis  ? Duloxetine Other (See Comments)  ? Gave him weird dreams  ? Gabapentin   ? Confusion   ? ?  ? ?  ?Medication List  ?  ? ?  ? Accurate as of January 29, 2022 11:16 AM. If you have any questions, ask your nurse or doctor.  ?  ?  ? ?  ? ?allopurinol 300 MG tablet ?Commonly known as: ZYLOPRIM ?Take 300 mg by mouth daily. ?  ?Alpha Lipoic Acid 200 MG Caps ?Take 200 mg by mouth 2 (two) times daily. ?  ?amLODipine 5 MG tablet ?Commonly known as: NORVASC ?Take 5 mg by mouth  daily. ?  ?aspirin EC 81 MG tablet ?Take 81 mg by mouth daily. Swallow whole. ?  ?Calcium 600+D 600-20 MG-MCG Tabs ?Generic drug: Calcium Carb-Cholecalciferol ?Take 1 tablet by mouth daily. ?  ?cetirizine 10 MG tablet ?Commonly known as: ZYRTEC ?Take 10 mg by mouth daily as needed for allergies. ?  ?CoQ-10 100 MG Caps ?Take 100 mg by mouth every evening. ?  ?EPINEPHrine 0.3 mg/0.3 mL Soaj injection ?Commonly known as: EPI-PEN ?Inject 0.3 mg into the muscle as needed for anaphylaxis. ?  ?hydrochlorothiazide 25 MG tablet ?Commonly known as: HYDRODIURIL ?Take 1 tablet (25 mg total) by mouth daily. ?  ?levothyroxine 100 MCG tablet ?Commonly known as: SYNTHROID ?Take 100 mcg by mouth daily before breakfast. ?  ?lisinopril 40 MG tablet ?Commonly known as: ZESTRIL ?Take 40 mg by mouth every evening. ?  ?Magnesium 250 MG Tabs ?Take 250 mg by mouth daily. ?  ?metoprolol succinate 25 MG 24 hr tablet ?Commonly known as: TOPROL-XL ?Take 25 mg by mouth daily. ?  ?multivitamin with minerals Tabs tablet ?Take 1 tablet by mouth daily. ?  ?pravastatin 20 MG tablet ?Commonly known as: PRAVACHOL ?Take 20 mg by mouth daily. ?  ?REFRESH RELIEVA OP ?Place 1 drop into both eyes 2 (two) times daily as needed (dry eyes). ?  ?tamsulosin 0.4  MG Caps capsule ?Commonly known as: FLOMAX ?Take 0.4 mg by mouth daily after supper. ?  ? ?  ? ? ?Allergies:  ?Allergies  ?Allergen Reactions  ? Fire Dynegy Anaphylaxis  ? Duloxetine Other (See Comments)  ?  Gave him weird dreams  ? Gabapentin   ?  Confusion   ? ? ?Family History: ?Family History  ?Problem Relation Age of Onset  ? Hypertension Other   ? ? ?Social History:  reports that he has never smoked. He has never used smokeless tobacco. He reports current alcohol use. He reports that he does not use drugs. ? ? ?Physical Exam: ?BP 130/70   Pulse 74   Ht '6\' 1"'$  (1.854 m)   Wt 245 lb (111.1 kg)   BMI 32.32 kg/m?   ?Constitutional:  Alert and oriented, No acute distress. ?HEENT: [place] AT, moist mucus  membranes.  Trachea midline, no masses. ?Cardiovascular: No clubbing, cyanosis, or edema. ?Respiratory: Normal respiratory effort, no increased work of breathing. ?GU: Prostate 60 g, smooth without nodules ? ? ?Assessment & Plan:   ? ?1.  Elevated PSA ?PSA stable, benign DRE ?He desires to continue surveillance ?We discussed other options including prostate biopsy and prostate MRI ?Follow-up 1 year with PSA ?He will get his PSA checked in 6 months with his PCP ? ?2.  BPH with LUTS ?Stable LUTS on tamsulosin ?He did inquire about UroLift.  The procedure was discussed.  He is presently not interested in pursuing surgical management. ? ? ?Abbie Sons, MD ? ?[facility] ?[address], [address] ?[place], [place] [address] ?[phone] ? ?

## 2022-02-03 ENCOUNTER — Encounter: Payer: Self-pay | Admitting: Urology

## 2022-03-06 ENCOUNTER — Encounter: Payer: Self-pay | Admitting: Unknown Physician Specialty

## 2022-03-10 NOTE — Discharge Instructions (Signed)
[facility] DISCHARGE INSTRUCTIONS FOR MYRINGOTOMY AND TUBE INSERTION  [facility] CHAPMAN T. MCQUEEN, M.D.   Diet:   After surgery, the patient should take only liquids and foods as tolerated.  The patient may then have a regular diet after the effects of anesthesia have worn off, usually about four to six hours after surgery.  Activities:   The patient should rest until the effects of anesthesia have worn off.  After this, there are no restrictions on the normal daily activities.  Medications:   You will be given a prescription for antibiotic drops to be used in the ears postoperatively.  It is recommended to use 4 drops 2 times a day for 7 days, then the drops should be saved for possible future use.  The tubes should not cause any discomfort to the patient, but if there is any question, Tylenol should be given according to the instructions for the age of the patient.  Other medications should be continued normally.  Precautions:   Should there be recurrent drainage after the tubes are placed, the drops should be used for approximately 3-4 days.  If it does not clear, you should call the ENT office.  Earplugs:   Earplugs are only needed for those who are going to be submerged under water.  When taking a bath or shower and using a cup or showerhead to rinse hair, it is not necessary to wear earplugs.  These come in a variety of fashions, all of which can be obtained at our office.  However, if one is not able to come by the office, then silicone plugs can be found at most pharmacies.  It is not advised to stick anything in the ear that is not approved as an earplug.  Silly putty is not to be used as an earplug.  Swimming is allowed in patients after ear tubes are inserted, however, they must wear earplugs if they are going to be submerged under water.  For those children who are going to be swimming a lot, it is recommended to use a fitted ear mold, which can be  made by our audiologist.  If discharge is noticed from the ears, this most likely represents an ear infection.  We would recommend getting your eardrops and using them as indicated above.  If it does not clear, then you should call the ENT office.  For follow up, the patient should return to the ENT office three weeks postoperatively and then every six months as required by the doctor. 

## 2022-03-14 ENCOUNTER — Encounter: Payer: Self-pay | Admitting: Unknown Physician Specialty

## 2022-03-14 ENCOUNTER — Other Ambulatory Visit: Payer: Self-pay

## 2022-03-14 ENCOUNTER — Ambulatory Visit: Payer: Medicare Other | Admitting: Anesthesiology

## 2022-03-14 ENCOUNTER — Ambulatory Visit
Admission: RE | Admit: 2022-03-14 | Discharge: 2022-03-14 | Disposition: A | Discharge status: Home / Self Care | Payer: Medicare Other | Attending: Unknown Physician Specialty | Admitting: Unknown Physician Specialty

## 2022-03-14 ENCOUNTER — Encounter
Admission: RE | Disposition: A | Discharge status: Home / Self Care | Payer: Self-pay | Source: Home / Self Care | Attending: Unknown Physician Specialty

## 2022-03-14 DIAGNOSIS — Z6833 Body mass index (BMI) 33.0-33.9, adult: Secondary | ICD-10-CM | POA: Diagnosis not present

## 2022-03-14 DIAGNOSIS — N058 Unspecified nephritic syndrome with other morphologic changes: Secondary | ICD-10-CM | POA: Diagnosis not present

## 2022-03-14 DIAGNOSIS — E039 Hypothyroidism, unspecified: Secondary | ICD-10-CM | POA: Insufficient documentation

## 2022-03-14 DIAGNOSIS — I2510 Atherosclerotic heart disease of native coronary artery without angina pectoris: Secondary | ICD-10-CM | POA: Diagnosis not present

## 2022-03-14 DIAGNOSIS — I7121 Aneurysm of the ascending aorta, without rupture: Secondary | ICD-10-CM | POA: Insufficient documentation

## 2022-03-14 DIAGNOSIS — M109 Gout, unspecified: Secondary | ICD-10-CM | POA: Diagnosis not present

## 2022-03-14 DIAGNOSIS — H6591 Unspecified nonsuppurative otitis media, right ear: Secondary | ICD-10-CM | POA: Diagnosis not present

## 2022-03-14 DIAGNOSIS — H6991 Unspecified Eustachian tube disorder, right ear: Secondary | ICD-10-CM | POA: Insufficient documentation

## 2022-03-14 DIAGNOSIS — G4730 Sleep apnea, unspecified: Secondary | ICD-10-CM | POA: Diagnosis not present

## 2022-03-14 DIAGNOSIS — E669 Obesity, unspecified: Secondary | ICD-10-CM | POA: Insufficient documentation

## 2022-03-14 DIAGNOSIS — M1990 Unspecified osteoarthritis, unspecified site: Secondary | ICD-10-CM | POA: Insufficient documentation

## 2022-03-14 DIAGNOSIS — E785 Hyperlipidemia, unspecified: Secondary | ICD-10-CM | POA: Insufficient documentation

## 2022-03-14 DIAGNOSIS — I10 Essential (primary) hypertension: Secondary | ICD-10-CM | POA: Diagnosis not present

## 2022-03-14 DIAGNOSIS — G609 Hereditary and idiopathic neuropathy, unspecified: Secondary | ICD-10-CM | POA: Insufficient documentation

## 2022-03-14 HISTORY — DX: Antineutrophilic cytoplasmic antibody (ANCA) vasculitis: I77.82

## 2022-03-14 HISTORY — DX: Sleep apnea, unspecified: G47.30

## 2022-03-14 HISTORY — PX: MYRINGOTOMY WITH TUBE PLACEMENT: SHX5663

## 2022-03-14 HISTORY — DX: Polyneuropathy, unspecified: G62.9

## 2022-03-14 HISTORY — DX: Aneurysm of the ascending aorta, without rupture: I71.21

## 2022-03-14 SURGERY — MYRINGOTOMY WITH TUBE PLACEMENT
Anesthesia: General | Site: Ear | Laterality: Right

## 2022-03-14 MED ORDER — LACTATED RINGERS IV SOLN: INTRAVENOUS | Status: DC

## 2022-03-14 MED ORDER — CIPROFLOXACIN-DEXAMETHASONE 0.3-0.1 % OT SUSP
OTIC | Status: DC | PRN
  Administered 2022-03-14: 1 [drp] via OTIC

## 2022-03-14 MED ORDER — LIDOCAINE HCL (CARDIAC) PF 100 MG/5ML IV SOSY
PREFILLED_SYRINGE | INTRAVENOUS | Status: DC | PRN
  Administered 2022-03-14: 30 mg via INTRATRACHEAL

## 2022-03-14 MED ORDER — PROPOFOL 10 MG/ML IV BOLUS
INTRAVENOUS | Status: DC | PRN
  Administered 2022-03-14: 140 mg via INTRAVENOUS

## 2022-03-14 SURGICAL SUPPLY — 10 items
BALL CTTN LRG ABS STRL LF (GAUZE/BANDAGES/DRESSINGS) ×1
BLADE MYR LANCE NRW W/HDL (BLADE) ×1 IMPLANT
CANISTER SUCT 1200ML W/VALVE (MISCELLANEOUS) ×2 IMPLANT
COTTONBALL LRG STERILE PKG (GAUZE/BANDAGES/DRESSINGS) ×2 IMPLANT
GLOVE SURG ENC TEXT LTX SZ7.5 (GLOVE) ×2 IMPLANT
STRAP BODY AND KNEE 60X3 (MISCELLANEOUS) ×2 IMPLANT
TOWEL OR 17X26 4PK STRL BLUE (TOWEL DISPOSABLE) ×2 IMPLANT
TUBE EAR T 1.27X5.3 BFLY (OTOLOGIC RELATED) ×1 IMPLANT
TUBING CONN 6MMX3.1M (TUBING) ×1
TUBING SUCTION CONN 0.25 STRL (TUBING) ×1 IMPLANT

## 2022-03-14 NOTE — Anesthesia Procedure Notes (Signed)
Procedure Name: General with mask airway ?Date/Time: 03/14/2022 1:18 PM ?Performed by: Cameron Ali, CRNA ?Pre-anesthesia Checklist: Patient identified, Emergency Drugs available, Suction available, Timeout performed and Patient being monitored ?Patient Re-evaluated:Patient Re-evaluated prior to induction ?Oxygen Delivery Method: Circle system utilized ?Preoxygenation: Pre-oxygenation with 100% oxygen ?Induction Type: Inhalational induction ?Ventilation: Mask ventilation without difficulty and Mask ventilation throughout procedure ?Dental Injury: Teeth and Oropharynx as per pre-operative assessment  ? ? ? ? ?

## 2022-03-14 NOTE — Anesthesia Postprocedure Evaluation (Signed)
Anesthesia Post Note ? ?Patient: Logan Murphy ? ?Procedure(s) Performed: MYRINGOTOMY WITH BUTTERFLY TUBE PLACEMENT (Right: Ear) ? ? ?  ?Patient location during evaluation: PACU ?Anesthesia Type: General ?Level of consciousness: awake ?Pain management: pain level controlled ?Vital Signs Assessment: post-procedure vital signs reviewed and stable ?Respiratory status: respiratory function stable ?Cardiovascular status: stable ?Postop Assessment: no signs of nausea or vomiting ?Anesthetic complications: no ? ? ?No notable events documented. ? ?Veda Canning ? ? ? ? ? ?

## 2022-03-14 NOTE — Op Note (Signed)
03/14/2022 ? ?1:25 PM ? ? ? ?Veto Kemps ? ?[id number] ? ? ?Pre-Op Dx: Eustachian tube dysfunction ? ?Post-op Dx: SAME ? ?Proc: Right myringotomy and butterfly tube placement ? ?Surg:  Roena Malady ? ?Anes:  GOT ? ?EBL: 0 ? ?Comp: None ? ?Findings: Serous otitis right middle ear ? ?Procedure: Evon was identified in the holding area taken the operating placed in supine position.  After IV sedation the operating microscope was brought in the field.  The right ear was examined and cleaned of cerumen.  An inferior myringotomy was performed there was serous fluid in middle ear space which was suctioned free.  A butterfly tube was then placed in the myringotomy followed by Ciprodex drop the patient then returned to anesthesia where he was awakened in the operating room taken recovery in stable condition ? ?Dispo:   Good ? ?Plan: Discharged home follow-up 3 weeks ? ?Roena Malady ? ?03/14/2022 ?1:25 PM ?  ?

## 2022-03-14 NOTE — Transfer of Care (Signed)
Immediate Anesthesia Transfer of Care Note ? ?Patient: TOAN MORT ? ?Procedure(s) Performed: MYRINGOTOMY WITH BUTTERFLY TUBE PLACEMENT (Right: Ear) ? ?Patient Location: PACU ? ?Anesthesia Type: General ? ?Level of Consciousness: awake, alert  and patient cooperative ? ?Airway and Oxygen Therapy: Patient Spontanous Breathing and Patient connected to supplemental oxygen ? ?Post-op Assessment: Post-op Vital signs reviewed, Patient's Cardiovascular Status Stable, Respiratory Function Stable, Patent Airway and No signs of Nausea or vomiting ? ?Post-op Vital Signs: Reviewed and stable ? ?Complications: No notable events documented. ? ?

## 2022-03-14 NOTE — H&P (Signed)
The patient's history has been reviewed, patient examined, no change in status, stable for surgery.  Questions were answered to the patients satisfaction.  

## 2022-03-14 NOTE — Anesthesia Preprocedure Evaluation (Signed)
Anesthesia Evaluation  ?Patient identified by MRN, date of birth, ID band ?Patient awake ? ? ? ?Reviewed: ?Allergy & Precautions, NPO status  ? ?Airway ?Mallampati: II ? ?TM Distance: >3 FB ? ? ? ? Dental ?  ?Pulmonary ?sleep apnea ,  ?  ?Pulmonary exam normal ? ? ? ? ? ? ? Cardiovascular ?hypertension, + CAD  ? ?Rhythm:Regular Rate:Normal ? ?HLD ? ?Ascending aortic aneurysm ?  ?Neuro/Psych ? Neuromuscular disease (idiopathic peripheral neuropathy)   ? GI/Hepatic ?  ?Endo/Other  ?Hypothyroidism Obesity - BMI 33 ? 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 ?  ?Musculoskeletal ? ?(+) Arthritis , Gout  ? Abdominal ?  ?Peds ? Hematology ?  ?Anesthesia Other Findings ? ? Reproductive/Obstetrics ? ?  ? ? ? ? ? ? ? ? ? ? ? ? ? ?  ?  ? ? ? ? ? ? ? ? ?Anesthesia Physical ?Anesthesia Plan ? ?ASA: 3 ? ?Anesthesia Plan: General  ? ?Post-op Pain Management:   ? ?Induction: Intravenous ? ?PONV Risk Score and Plan: Propofol infusion, TIVA and Treatment may vary due to age or medical condition ? ?Airway Management Planned: Natural Airway and Nasal Cannula ? ?Additional Equipment:  ? ?Intra-op Plan:  ? ?Post-operative Plan:  ? ?Informed Consent: I have reviewed the patients History and Physical, chart, labs and discussed the procedure including the risks, benefits and alternatives for the proposed anesthesia with the patient or authorized representative who has indicated his/her understanding and acceptance.  ? ? ? ? ? ?Plan Discussed with: CRNA ? ?Anesthesia Plan Comments:   ? ? ? ? ? ? ?Anesthesia Quick Evaluation ? ?

## 2022-05-27 ENCOUNTER — Ambulatory Visit
Admission: RE | Admit: 2022-05-27 | Discharge: 2022-05-27 | Disposition: A | Discharge status: Home / Self Care | Payer: Medicare Other | Source: Ambulatory Visit | Attending: Rheumatology | Admitting: Rheumatology

## 2022-05-27 DIAGNOSIS — A5002 Early congenital syphilitic osteochondropathy: Secondary | ICD-10-CM | POA: Insufficient documentation

## 2022-05-27 DIAGNOSIS — M3130 Wegener's granulomatosis without renal involvement: Secondary | ICD-10-CM | POA: Insufficient documentation

## 2022-05-27 DIAGNOSIS — Z01818 Encounter for other preprocedural examination: Secondary | ICD-10-CM | POA: Insufficient documentation

## 2022-05-27 MED ORDER — ACETAMINOPHEN 500 MG PO TABS
ORAL_TABLET | ORAL | Status: AC
  Administered 2022-05-27: 500 mg via ORAL
  Filled 2022-05-27: qty 1

## 2022-05-27 MED ORDER — ACETAMINOPHEN 500 MG PO TABS: 500.0000 mg | ORAL_TABLET | Freq: Four times a day (QID) | ORAL | Status: DC | PRN

## 2022-05-27 MED ORDER — METHYLPREDNISOLONE SODIUM SUCC 125 MG IJ SOLR
INTRAMUSCULAR | Status: AC
  Administered 2022-05-27: 25 mg
  Filled 2022-05-27: qty 2

## 2022-05-27 MED ORDER — DIPHENHYDRAMINE HCL 25 MG PO CAPS
ORAL_CAPSULE | ORAL | Status: AC
  Filled 2022-05-27: qty 1

## 2022-05-27 MED ORDER — DIPHENHYDRAMINE HCL 25 MG PO CAPS
ORAL_CAPSULE | ORAL | Status: AC
  Administered 2022-05-27: 50 mg via ORAL
  Filled 2022-05-27: qty 2

## 2022-05-27 MED ORDER — DIPHENHYDRAMINE HCL 25 MG PO CAPS: 50.0000 mg | ORAL_CAPSULE | Freq: Once | ORAL | Status: AC

## 2022-05-27 MED ORDER — DIPHENHYDRAMINE HCL 25 MG PO CAPS: 25.0000 mg | ORAL_CAPSULE | Freq: Once | ORAL | Status: DC

## 2022-05-27 MED ORDER — SODIUM CHLORIDE 0.9 % IV SOLN
1000.0000 mg | Freq: Once | INTRAVENOUS | Status: AC
  Administered 2022-05-27: 1000 mg via INTRAVENOUS
  Filled 2022-05-27: qty 100

## 2022-05-27 MED ORDER — ACETAMINOPHEN 500 MG PO TABS: 500.0000 mg | ORAL_TABLET | Freq: Once | ORAL | Status: DC

## 2022-05-27 MED ORDER — RITUXIMAB CHEMO INJECTION 500 MG/50ML: 1000.0000 mg | Freq: Once | INTRAVENOUS | Status: DC

## 2022-05-27 MED ORDER — METHYLPREDNISOLONE SODIUM SUCC 40 MG IJ SOLR
25.0000 mg | Freq: Once | INTRAMUSCULAR | Status: DC
  Filled 2022-05-27: qty 1

## 2022-05-27 NOTE — Progress Notes (Signed)
No new infections, no new symptoms, No recent vaccines

## 2022-06-10 IMAGING — CT CT ANGIO CHEST
2 of 6 series · 13 of 36 positions shown · IV contrast (agent unspecified)
Comparison: Prior CT scan of the chest 11/23/2016 and 01/11/2018

CLINICAL DATA: History of thoracic aortic aneurysm

EXAM:
CT ANGIOGRAPHY CHEST WITH CONTRAST
TECHNIQUE: Multidetector CT imaging of the chest was performed using the
standard protocol during bolus administration of intravenous
contrast. Multiplanar CT image reconstructions and MIPs were
obtained to evaluate the vascular anatomy.

[Series 4: axial arterial cta thorax 2.00 · axial · arterial · 0.74mm/px · z∈[-1221,-919]mm · 12 of 179 slices shown]
[im 14/179  lung]
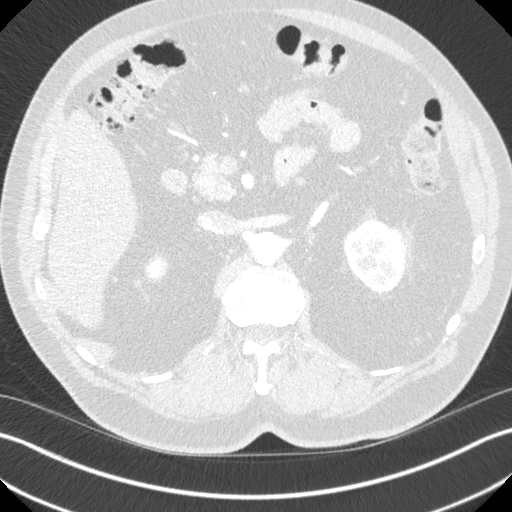
[im 28/179  mediastinal]
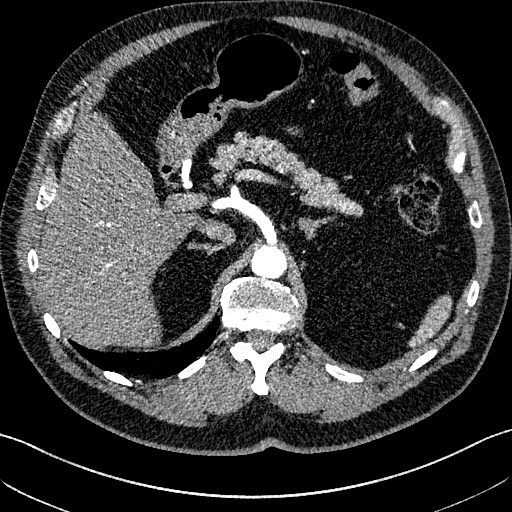
[im 42/179  lung]
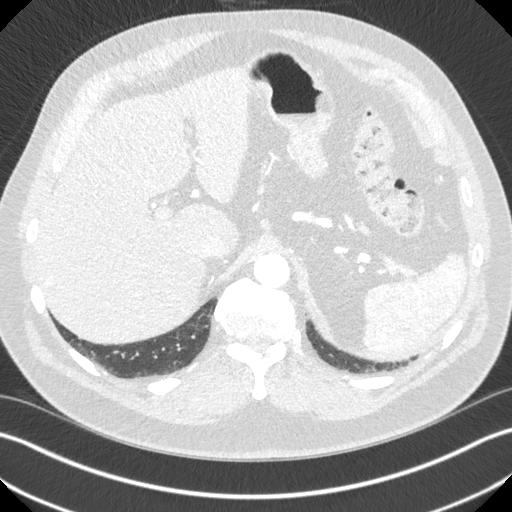
[im 55/179  mediastinal]
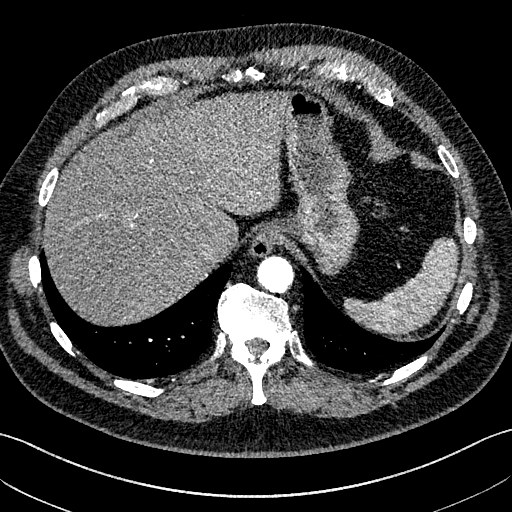
[im 69/179  lung]
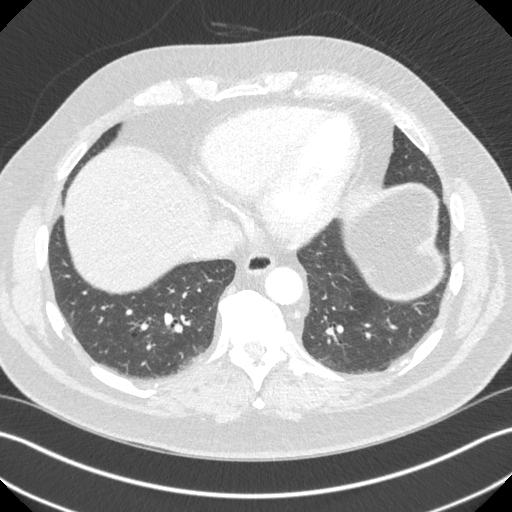
[im 83/179  mediastinal]
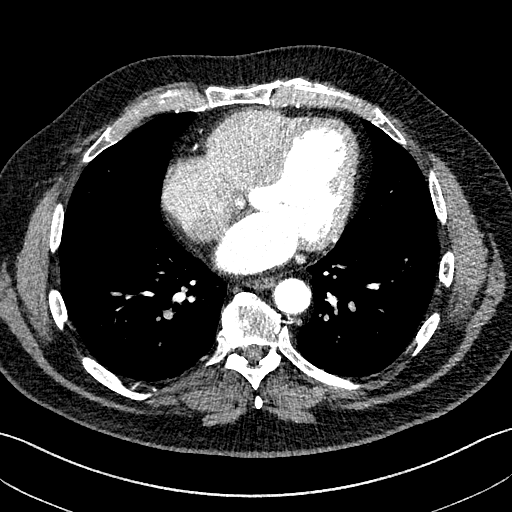
[im 96/179  lung]
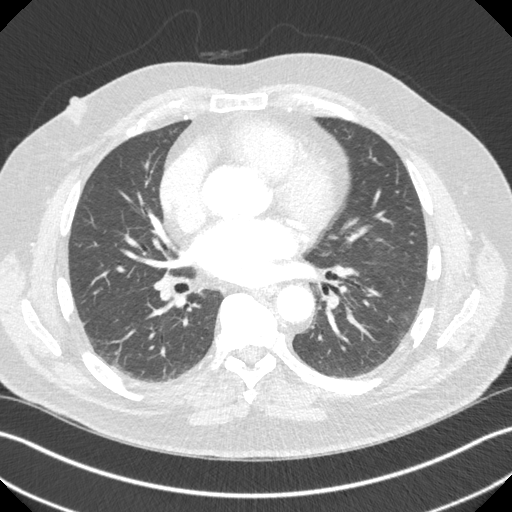
[im 110/179  mediastinal]
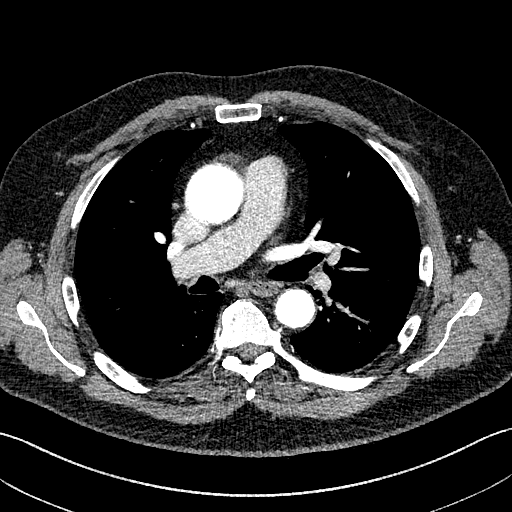
[im 124/179  lung]
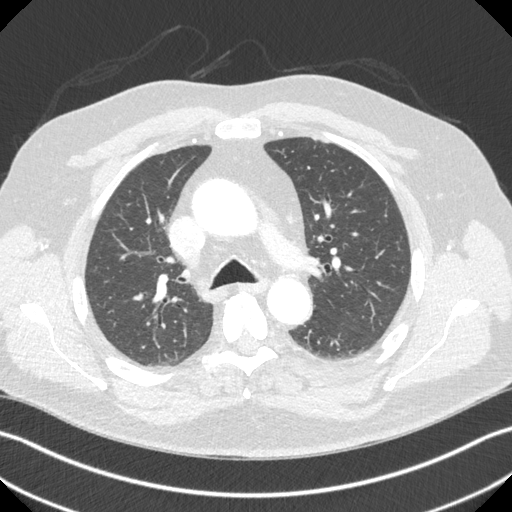
[im 137/179  mediastinal]
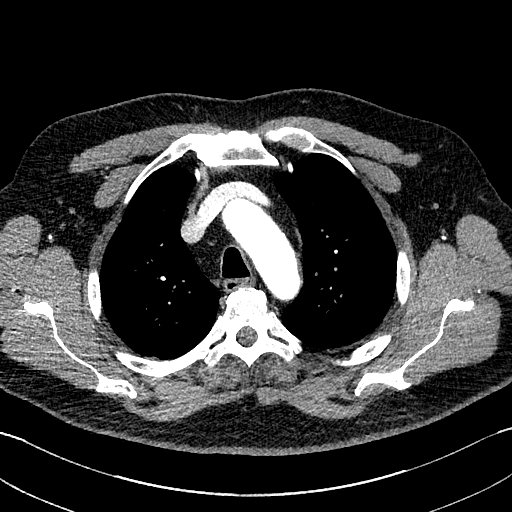
[im 151/179  lung]
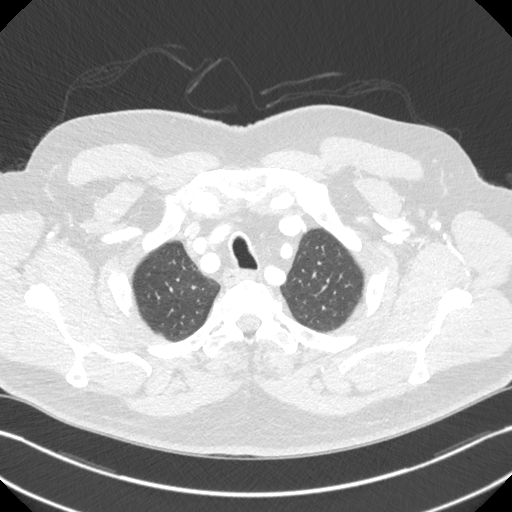
[im 165/179  mediastinal]
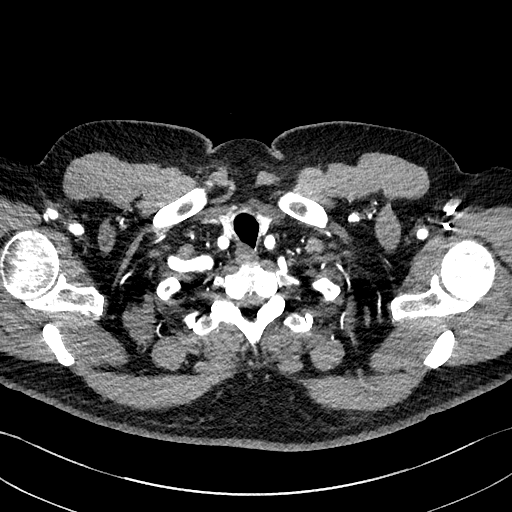

[Series 7: cor st cta thorax 2.00 cor · coronal · 0.70mm/px · 1 of 164 slices shown]
[im 82/164  mediastinal]
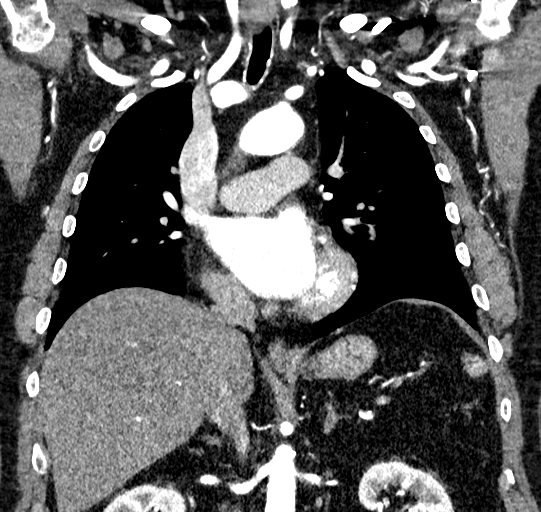

[13 of 36 positions shown; findings below may reference images not displayed]

RADIATION DOSE REDUCTION: This exam was performed according to the
departmental dose-optimization program which includes automated
exposure control, adjustment of the mA and/or kV according to
patient size and/or use of iterative reconstruction technique.

CONTRAST:  75mL OMNIPAQUE IOHEXOL 350 MG/ML SOLN
FINDINGS: Cardiovascular: Conventional 3 vessel arch anatomy. The aortic root
is normal in caliber at 3.9 cm. No effacement of the [place]
junction which measures 3.2 cm. Minimal aneurysmal dilation of the
ascending portion of the thoracic aorta at 4.0 cm. Prior measurement
of 4.3 cm may have been exaggerated by cardiac motion artifact. No
evidence of dissection. Mild scattered atherosclerotic
calcifications. Unremarkable main pulmonary artery. Calcified plaque
along the left anterior descending coronary artery. The heart is
normal in size. No pericardial effusion.

Mediastinum/Nodes: Unremarkable CT appearance of the thyroid gland.
No suspicious mediastinal or hilar adenopathy. No soft tissue
mediastinal mass. The thoracic esophagus is unremarkable.

Lungs/Pleura: Minimal dependent atelectasis. No suspicious pulmonary
mass or nodule. No pleural effusion or pneumothorax.

Upper Abdomen: Surgical changes of prior cholecystectomy. Low
attenuation of the hepatic parenchyma suggests hepatic steatosis. No
acute abnormality in the upper abdomen. Small low-attenuation
lesions in the left kidney are too small to characterize but
consistent with a small renal cortical infarct and probable small
cyst. Punctate nonobstructing stone in the upper pole of the right
kidney.

Musculoskeletal: No acute fracture or aggressive appearing lytic or
blastic osseous lesion.

Review of the MIP images confirms the above findings.
IMPRESSION: 1. Minimal aneurysmal dilation of the ascending thoracic aorta with
a transverse diameter of 4.0 cm. Prior measurement of 4.3 cm likely
exaggerated by cardiac motion artifact. Recommend annual imaging
followup by CTA or MRA. This recommendation follows 7070
ACCF/AHA/AATS/ACR/ASA/SCA/KARLIE/UDA/KIMURA/HYROAD Guidelines for the
Diagnosis and Management of Patients with Thoracic Aortic Disease.
Circulation. 7070; 121: E266-e369. Aortic aneurysm NOS (38RFE-JN2.C)
2. Aortic and coronary artery atherosclerotic calcifications.
3. Punctate nonobstructing stone in the upper pole collecting system
of the right kidney.
4. Hepatic steatosis.

Aortic aneurysm NOS (38RFE-JN2.C); Aortic Atherosclerosis
(38RFE-68R.R).

## 2022-09-04 ENCOUNTER — Inpatient Hospital Stay: Admission: AD | Admit: 2022-09-04 | Payer: Medicare Other | Admitting: Internal Medicine

## 2022-09-04 ENCOUNTER — Encounter (HOSPITAL_COMMUNITY): Payer: Self-pay

## 2022-09-05 DIAGNOSIS — R55 Syncope and collapse: Secondary | ICD-10-CM | POA: Diagnosis not present

## 2022-10-28 ENCOUNTER — Ambulatory Visit
Admission: RE | Admit: 2022-10-28 | Discharge: 2022-10-28 | Disposition: A | Discharge status: Home / Self Care | Payer: Medicare Other | Source: Ambulatory Visit | Attending: Rheumatology | Admitting: Rheumatology

## 2022-10-28 DIAGNOSIS — M3131 Wegener's granulomatosis with renal involvement: Secondary | ICD-10-CM | POA: Insufficient documentation

## 2022-10-28 MED ORDER — DIPHENHYDRAMINE HCL 25 MG PO CAPS: 50.0000 mg | ORAL_CAPSULE | Freq: Once | ORAL | Status: AC

## 2022-10-28 MED ORDER — ACETAMINOPHEN 500 MG PO TABS: 500.0000 mg | ORAL_TABLET | Freq: Once | ORAL | Status: AC

## 2022-10-28 MED ORDER — METHYLPREDNISOLONE SODIUM SUCC 125 MG IJ SOLR
INTRAMUSCULAR | Status: AC
  Administered 2022-10-28: 25 mg via INTRAVENOUS
  Filled 2022-10-28: qty 2

## 2022-10-28 MED ORDER — SODIUM CHLORIDE 0.9 % IV SOLN
1000.0000 mg | Freq: Once | INTRAVENOUS | Status: AC
  Administered 2022-10-28: 1000 mg via INTRAVENOUS
  Filled 2022-10-28: qty 100

## 2022-10-28 MED ORDER — ACETAMINOPHEN 500 MG PO TABS
ORAL_TABLET | ORAL | Status: AC
  Administered 2022-10-28: 500 mg via ORAL
  Filled 2022-10-28: qty 1

## 2022-10-28 MED ORDER — METHYLPREDNISOLONE SODIUM SUCC 125 MG IJ SOLR: 125.0000 mg | Freq: Once | INTRAMUSCULAR | Status: AC

## 2022-10-28 MED ORDER — DIPHENHYDRAMINE HCL 25 MG PO CAPS
ORAL_CAPSULE | ORAL | Status: AC
  Administered 2022-10-28: 50 mg via ORAL
  Filled 2022-10-28: qty 2

## 2022-12-25 ENCOUNTER — Other Ambulatory Visit: Payer: Self-pay | Admitting: Student

## 2022-12-25 DIAGNOSIS — S0990XA Unspecified injury of head, initial encounter: Secondary | ICD-10-CM

## 2022-12-31 ENCOUNTER — Ambulatory Visit
Admission: RE | Admit: 2022-12-31 | Discharge: 2022-12-31 | Disposition: A | Discharge status: Home / Self Care | Payer: Medicare Other | Source: Ambulatory Visit | Attending: Student | Admitting: Student

## 2022-12-31 DIAGNOSIS — S0990XA Unspecified injury of head, initial encounter: Secondary | ICD-10-CM

## 2022-12-31 MED ORDER — IOPAMIDOL (ISOVUE-300) INJECTION 61%
75.0000 mL | Freq: Once | INTRAVENOUS | Status: AC | PRN
  Administered 2022-12-31: 75 mL via INTRAVENOUS

## 2023-01-29 ENCOUNTER — Other Ambulatory Visit: Payer: Self-pay | Admitting: *Deleted

## 2023-01-29 DIAGNOSIS — R972 Elevated prostate specific antigen [PSA]: Secondary | ICD-10-CM

## 2023-01-29 DIAGNOSIS — N401 Enlarged prostate with lower urinary tract symptoms: Secondary | ICD-10-CM

## 2023-01-30 ENCOUNTER — Other Ambulatory Visit: Payer: Medicare Other

## 2023-01-30 DIAGNOSIS — R972 Elevated prostate specific antigen [PSA]: Secondary | ICD-10-CM

## 2023-01-30 DIAGNOSIS — N401 Enlarged prostate with lower urinary tract symptoms: Secondary | ICD-10-CM

## 2023-01-31 LAB — PSA: Prostate Specific Ag, Serum: 6.1 ng/mL — ABNORMAL HIGH (ref 0.0–4.0)

## 2023-02-04 ENCOUNTER — Ambulatory Visit (INDEPENDENT_AMBULATORY_CARE_PROVIDER_SITE_OTHER): Payer: Medicare Other | Admitting: Urology

## 2023-02-04 ENCOUNTER — Encounter: Payer: Self-pay | Admitting: Urology

## 2023-02-04 VITALS — BP 138/70 | HR 72 | Ht 73.0 in | Wt 247.0 lb

## 2023-02-04 DIAGNOSIS — N401 Enlarged prostate with lower urinary tract symptoms: Secondary | ICD-10-CM

## 2023-02-04 DIAGNOSIS — R972 Elevated prostate specific antigen [PSA]: Secondary | ICD-10-CM

## 2023-02-04 NOTE — Progress Notes (Signed)
I, Logan Murphy,acting as a scribe for Abbie Sons, MD.,have documented all relevant documentation on the behalf of Abbie Sons, MD,as directed by  Abbie Sons, MD while in the presence of Abbie Sons, MD.   02/04/23 12:04 PM   [name] [birth year] [id number]  Referring provider: Gladstone Lighter, MD [place],  [place] [address]  Chief Complaint  Patient presents with   Elevated PSA    Urologic history: 1.  Elevated PSA Initially seen 12/2020 PSA 5.86 Elected surveillance  2.  BPH with LUTS Tamsulosin daily   HPI: 77 y.o. male presents for 75-month follow-up.  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[place])     Surgical History: Past Surgical History:  Procedure Laterality Date   CHOLECYSTECTOMY     GALLBLADDER SURGERY     LEFT HEART CATH AND CORONARY ANGIOGRAPHY N/A 02/27/2021   Procedure: LEFT HEART CATH AND CORONARY ANGIOGRAPHY;  Surgeon: Corey Skains, MD;  Location: [facility];  Service: Cardiovascular;  Laterality: N/A;   MYRINGOTOMY WITH TUBE PLACEMENT Right 03/14/2022   Procedure: MYRINGOTOMY WITH BUTTERFLY TUBE PLACEMENT;  Surgeon: Beverly Gust, MD;  Location: [facility];  Service: ENT;  Laterality: Right;  Sleep apnea   TOTAL HIP ARTHROPLASTY Right    VIDEO BRONCHOSCOPY Bilateral 01/13/2018   Procedure: VIDEO BRONCHOSCOPY WITH FLUORO;  Surgeon: [name], MD;  Location: [facility];  Service: Cardiopulmonary;  Laterality: Bilateral;    Home  Medications:  Allergies as of 02/04/2023       Reactions   Fire Ant Anaphylaxis   Duloxetine Other (See Comments)   Gave him weird dreams   Gabapentin    Confusion         Medication List        Accurate as of February 04, 2023 12:04 PM. If you have any questions, ask your nurse or doctor.          STOP taking these medications    metoprolol succinate 25 MG 24 hr tablet Commonly known as: TOPROL-XL Stopped by: Abbie Sons, MD       TAKE these medications    allopurinol 300 MG tablet Commonly known as: ZYLOPRIM Take 300 mg by mouth daily.   Alpha Lipoic Acid 200 MG Caps Take 200 mg by mouth 2 (two) times daily.   amLODipine 5 MG tablet Commonly known as: NORVASC Take 7.5 mg by mouth daily.   aspirin EC 81 MG tablet Take 81 mg by mouth daily. Swallow whole.   Calcium 600+D 600-20 MG-MCG Tabs Generic drug: Calcium Carb-Cholecalciferol Take 1 tablet by mouth daily.   cetirizine 10 MG tablet Commonly known as: ZYRTEC Take 10 mg by mouth daily as needed for allergies.   CoQ-10 100 MG Caps Take 100 mg by mouth every evening.   EPINEPHrine 0.3 mg/0.3 mL Soaj injection Commonly known as: EPI-PEN Inject 0.3 mg into the muscle as  needed for anaphylaxis.   hydrochlorothiazide 25 MG tablet Commonly known as: HYDRODIURIL Take 1 tablet (25 mg total) by mouth daily.   levothyroxine 100 MCG tablet Commonly known as: SYNTHROID Take 100 mcg by mouth daily before breakfast.   lisinopril 40 MG tablet Commonly known as: ZESTRIL Take 40 mg by mouth every evening.   Magnesium 250 MG Tabs Take 250 mg by mouth daily.   multivitamin with minerals Tabs tablet Take 1 tablet by mouth daily.   pravastatin 20 MG tablet Commonly known as: PRAVACHOL Take 20 mg by mouth daily.   REFRESH RELIEVA OP Place 1 drop into both eyes 2 (two) times daily as needed (dry eyes).   riTUXimab 1,000 mg in sodium chloride 0.9 % 250 mL Inject 1,000 mg into the vein every 6 (six)  months.   tamsulosin 0.4 MG Caps capsule Commonly known as: FLOMAX Take 0.4 mg by mouth daily after supper.        Allergies:  Allergies  Allergen Reactions   Fire Ant Anaphylaxis   Duloxetine Other (See Comments)    Gave him weird dreams   Gabapentin     Confusion     Family History: Family History  Problem Relation Age of Onset   Hypertension Other     Social History:  reports that he has never smoked. He has never used smokeless tobacco. He reports current alcohol use. He reports that he does not use drugs.   Physical Exam: BP 138/70   Pulse 72   Ht 6\' 1"  (1.854 m)   Wt 247 lb (112 kg)   BMI 32.59 kg/m   Constitutional:  Alert and oriented, No acute distress. HEENT: [place] AT Respiratory: Normal respiratory effort, no increased work of breathing. GU: Prostate 60 g, smooth without nodules  Assessment & Plan:    1.  Elevated PSA Slight PSA bump, benign DRE Recheck PSA in 6 months  MRI prostate if PSA continues to rise   2.  BPH with LUTS Stable LUTS on tamsulosin  I have reviewed the above documentation for accuracy and completeness, and I agree with the above.   Abbie Sons, [place] [address], [place], [place] [address] [phone]

## 2023-03-30 ENCOUNTER — Ambulatory Visit: Payer: Medicare Other

## 2023-04-13 ENCOUNTER — Inpatient Hospital Stay: Admission: RE | Admit: 2023-04-13 | Payer: Medicare Other | Source: Ambulatory Visit

## 2023-04-14 ENCOUNTER — Ambulatory Visit
Admission: RE | Admit: 2023-04-14 | Discharge: 2023-04-14 | Disposition: A | Discharge status: Home / Self Care | Payer: Medicare Other | Source: Ambulatory Visit | Attending: Rheumatology | Admitting: Rheumatology

## 2023-04-14 DIAGNOSIS — M3131 Wegener's granulomatosis with renal involvement: Secondary | ICD-10-CM | POA: Insufficient documentation

## 2023-04-14 MED ORDER — SODIUM CHLORIDE 0.9 % IV SOLN
1000.0000 mg | Freq: Once | INTRAVENOUS | Status: AC
  Administered 2023-04-14: 1000 mg via INTRAVENOUS
  Filled 2023-04-14: qty 100

## 2023-04-14 MED ORDER — DIPHENHYDRAMINE HCL 25 MG PO CAPS
50.0000 mg | ORAL_CAPSULE | Freq: Once | ORAL | Status: AC
  Administered 2023-04-14: 50 mg via ORAL

## 2023-04-14 MED ORDER — METHYLPREDNISOLONE SODIUM SUCC 125 MG IJ SOLR
125.0000 mg | Freq: Once | INTRAMUSCULAR | Status: AC
  Administered 2023-04-14: 62.5 mg via INTRAVENOUS
  Filled 2023-04-14: qty 2

## 2023-04-14 MED ORDER — ACETAMINOPHEN 500 MG PO TABS
500.0000 mg | ORAL_TABLET | Freq: Once | ORAL | Status: AC
  Administered 2023-04-14: 500 mg via ORAL

## 2023-04-14 MED ORDER — DIPHENHYDRAMINE HCL 25 MG PO CAPS
ORAL_CAPSULE | ORAL | Status: AC
  Filled 2023-04-14: qty 2

## 2023-04-14 MED ORDER — ACETAMINOPHEN 500 MG PO TABS
ORAL_TABLET | ORAL | Status: AC
  Filled 2023-04-14: qty 1

## 2023-08-06 ENCOUNTER — Other Ambulatory Visit: Payer: Self-pay | Admitting: *Deleted

## 2023-08-06 DIAGNOSIS — R972 Elevated prostate specific antigen [PSA]: Secondary | ICD-10-CM

## 2023-08-07 ENCOUNTER — Other Ambulatory Visit: Payer: Medicare Other

## 2023-08-10 ENCOUNTER — Other Ambulatory Visit: Payer: Medicare Other

## 2023-08-10 DIAGNOSIS — R972 Elevated prostate specific antigen [PSA]: Secondary | ICD-10-CM

## 2023-08-11 LAB — PSA: Prostate Specific Ag, Serum: 5.8 ng/mL — ABNORMAL HIGH (ref 0.0–4.0)

## 2023-09-15 ENCOUNTER — Ambulatory Visit
Admission: RE | Admit: 2023-09-15 | Discharge: 2023-09-15 | Disposition: A | Discharge status: Home / Self Care | Payer: Medicare Other | Source: Ambulatory Visit | Attending: Rheumatology | Admitting: Rheumatology

## 2023-09-15 DIAGNOSIS — M3131 Wegener's granulomatosis with renal involvement: Secondary | ICD-10-CM | POA: Diagnosis present

## 2023-09-15 MED ORDER — METHYLPREDNISOLONE SODIUM SUCC 125 MG IJ SOLR
INTRAMUSCULAR | Status: AC
  Filled 2023-09-15: qty 2

## 2023-09-15 MED ORDER — SODIUM CHLORIDE 0.9 % IV SOLN
1000.0000 mg | Freq: Once | INTRAVENOUS | Status: AC
  Administered 2023-09-15: 1000 mg via INTRAVENOUS
  Filled 2023-09-15: qty 100

## 2023-09-15 MED ORDER — ACETAMINOPHEN 500 MG PO TABS
500.0000 mg | ORAL_TABLET | Freq: Once | ORAL | Status: AC
  Administered 2023-09-15: 500 mg via ORAL

## 2023-09-15 MED ORDER — DIPHENHYDRAMINE HCL 25 MG PO CAPS
ORAL_CAPSULE | ORAL | Status: AC
  Filled 2023-09-15: qty 2

## 2023-09-15 MED ORDER — ACETAMINOPHEN 500 MG PO TABS
ORAL_TABLET | ORAL | Status: AC
  Filled 2023-09-15: qty 1

## 2023-09-15 MED ORDER — DIPHENHYDRAMINE HCL 50 MG/ML IJ SOLN
INTRAMUSCULAR | Status: AC
  Filled 2023-09-15: qty 1

## 2023-09-15 MED ORDER — DIPHENHYDRAMINE HCL 25 MG PO CAPS
50.0000 mg | ORAL_CAPSULE | Freq: Once | ORAL | Status: AC
  Administered 2023-09-15: 50 mg via ORAL

## 2023-09-15 MED ORDER — METHYLPREDNISOLONE SODIUM SUCC 125 MG IJ SOLR
125.0000 mg | Freq: Once | INTRAMUSCULAR | Status: AC
  Administered 2023-09-15: 62.5 mg via INTRAVENOUS

## 2024-02-01 ENCOUNTER — Other Ambulatory Visit: Payer: Self-pay

## 2024-02-01 DIAGNOSIS — R972 Elevated prostate specific antigen [PSA]: Secondary | ICD-10-CM

## 2024-02-02 ENCOUNTER — Other Ambulatory Visit: Payer: Medicare Other

## 2024-02-02 DIAGNOSIS — R972 Elevated prostate specific antigen [PSA]: Secondary | ICD-10-CM

## 2024-02-03 LAB — PSA: Prostate Specific Ag, Serum: 6.9 ng/mL — ABNORMAL HIGH (ref 0.0–4.0)

## 2024-02-04 ENCOUNTER — Encounter: Payer: Self-pay | Admitting: Urology

## 2024-02-04 ENCOUNTER — Ambulatory Visit (INDEPENDENT_AMBULATORY_CARE_PROVIDER_SITE_OTHER): Payer: Medicare Other | Admitting: Urology

## 2024-02-04 VITALS — BP 151/72 | HR 71 | Ht 73.0 in | Wt 250.0 lb

## 2024-02-04 DIAGNOSIS — N401 Enlarged prostate with lower urinary tract symptoms: Secondary | ICD-10-CM

## 2024-02-04 DIAGNOSIS — R972 Elevated prostate specific antigen [PSA]: Secondary | ICD-10-CM | POA: Diagnosis not present

## 2024-02-04 NOTE — Progress Notes (Signed)
 I, Maysun Anabel Bene, acting as a scribe for Riki Altes, MD., have documented all relevant documentation on the behalf of Riki Altes, MD, as directed by Riki Altes, MD while in the presence of Riki Altes, MD.  02/04/2024 3:00 PM   Logan Murphy 10-10-1946 [id number]  Referring provider: Enid Baas, MD [address] [place],  [place] [address]  Chief Complaint  Patient presents with   Elevated PSA   Urologic history: 1.  Elevated PSA Initially seen 12/2020 PSA 5.86 Elected surveillance   2.  BPH with LUTS Tamsulosin daily  HPI: Logan Murphy is a 78 y.o. male presents for annual follow-up.   At last year's visit, PSA was slightly above baseline at 6.1 and a 6 month follow-up PSA was lower at 5.8 No bothersome LUTS Denies dysuria, gross hematuria Denies flank, abdominal or pelvic pain Remains on tamsulosin.  PSA 02/02/24 has bumped to 6.9.   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)    Chronic pain    Gout    Gout    Hypertension    Neuropathy    feet   Skin cancer    Sleep apnea    CPAP   Thoracic ascending aortic aneurysm (HCC)    Thyroid disease    Vasculitis, ANCA positive (HCC)     Surgical History: Past Surgical History:  Procedure Laterality Date   CHOLECYSTECTOMY     GALLBLADDER SURGERY     LEFT HEART CATH AND CORONARY ANGIOGRAPHY N/A 02/27/2021   Procedure: LEFT HEART CATH AND CORONARY ANGIOGRAPHY;  Surgeon: Lamar Blinks, MD;  Location: [facility];  Service: Cardiovascular;  Laterality: N/A;   MYRINGOTOMY WITH TUBE PLACEMENT Right 03/14/2022   Procedure: MYRINGOTOMY WITH BUTTERFLY TUBE PLACEMENT;  Surgeon: Linus Salmons, MD;  Location: [facility];  Service: ENT;   Laterality: Right;  Sleep apnea   TOTAL HIP ARTHROPLASTY Right    VIDEO BRONCHOSCOPY Bilateral 01/13/2018   Procedure: VIDEO BRONCHOSCOPY WITH FLUORO;  Surgeon: Shane Crutch, MD;  Location: [facility];  Service: Cardiopulmonary;  Laterality: Bilateral;    Home Medications:  Allergies as of 02/04/2024       Reactions   Fire Ant Anaphylaxis   Duloxetine Other (See Comments)   Gave him weird dreams   Gabapentin    Confusion         Medication List        Accurate as of February 04, 2024  3:00 PM. If you have any questions, ask your nurse or doctor.          allopurinol 300 MG tablet Commonly known as: ZYLOPRIM Take 300 mg by mouth daily.   Alpha Lipoic Acid 200 MG Caps Take 200 mg by mouth 2 (two) times daily.   amLODipine 5 MG tablet Commonly known as: NORVASC Take 7.5 mg by mouth daily.   aspirin EC 81 MG tablet Take 81 mg by mouth daily. Swallow whole.   Calcium 600+D 600-800 MG-UNIT Tabs Generic drug: Calcium Carb-Cholecalciferol Take 1 tablet by mouth daily.   cetirizine 10 MG tablet Commonly known as: ZYRTEC Take 10 mg by mouth daily as needed for allergies.   CoQ-10  100 MG Caps Take 100 mg by mouth every evening.   EPINEPHrine 0.3 mg/0.3 mL Soaj injection Commonly known as: EPI-PEN Inject 0.3 mg into the muscle as needed for anaphylaxis.   hydrochlorothiazide 25 MG tablet Commonly known as: HYDRODIURIL Take 1 tablet (25 mg total) by mouth daily.   levothyroxine 100 MCG tablet Commonly known as: SYNTHROID Take 100 mcg by mouth daily before breakfast.   lisinopril 40 MG tablet Commonly known as: ZESTRIL Take 40 mg by mouth every evening.   Magnesium 250 MG Tabs Take 250 mg by mouth daily.   multivitamin with minerals Tabs tablet Take 1 tablet by mouth daily.   pravastatin 20 MG tablet Commonly known as: PRAVACHOL Take 20 mg by mouth daily.   REFRESH RELIEVA OP Place 1 drop into both eyes 2 (two) times daily as needed (dry eyes).    riTUXimab 1,000 mg in sodium chloride 0.9 % 250 mL Inject 1,000 mg into the vein every 6 (six) months.   tamsulosin 0.4 MG Caps capsule Commonly known as: FLOMAX Take 0.4 mg by mouth daily after supper.        Allergies:  Allergies  Allergen Reactions   Fire Ant Anaphylaxis   Duloxetine Other (See Comments)    Gave him weird dreams   Gabapentin     Confusion     Family History: Family History  Problem Relation Age of Onset   Hypertension Other     Social History:  reports that he has never smoked. He has never used smokeless tobacco. He reports current alcohol use. He reports that he does not use drugs.   Physical Exam: BP (!) 151/72   Pulse 71   Ht 6\' 1"  (1.854 m)   Wt 250 lb (113.4 kg)   BMI 32.98 kg/m   Constitutional:  Alert and oriented, No acute distress. HEENT: [place] AT Respiratory: Normal respiratory effort, no increased work of breathing. GU: Prostate 60 g, smooth without nodules Psychiatric: Normal mood and affect.   Assessment & Plan:    1. Elevated PSA We discussed the abnormal bump in his PSA, which may be related to transient inflammation.  Discussed prostate MRI to assess for lesions suspicious for high-grade prostate cancer. The alternative for a follow-up PSA and MRI persistently elevated was also discussed.  He has elected conservative management and will schedule a lab visit for a PSA in 4 months and prostate MRI if persistently elevated  2.  BPH with LUTS Stable LUTS on tamsulosin  I have reviewed the above documentation for accuracy and completeness, and I agree with the above.   Riki Altes, MD  [facility] [address] [place], [place] [address] [phone]

## 2024-02-15 ENCOUNTER — Ambulatory Visit
Admission: RE | Admit: 2024-02-15 | Discharge: 2024-02-15 | Disposition: A | Discharge status: Home / Self Care | Payer: Medicare Other | Source: Ambulatory Visit | Attending: Rheumatology | Admitting: Rheumatology

## 2024-02-15 DIAGNOSIS — M3130 Wegener's granulomatosis without renal involvement: Secondary | ICD-10-CM | POA: Insufficient documentation

## 2024-02-15 MED ORDER — METHYLPREDNISOLONE SODIUM SUCC 125 MG IJ SOLR
125.0000 mg | Freq: Once | INTRAMUSCULAR | Status: AC
  Administered 2024-02-15: 125 mg via INTRAVENOUS

## 2024-02-15 MED ORDER — ACETAMINOPHEN 500 MG PO TABS
ORAL_TABLET | ORAL | Status: AC
  Filled 2024-02-15: qty 1

## 2024-02-15 MED ORDER — DIPHENHYDRAMINE HCL 25 MG PO CAPS
50.0000 mg | ORAL_CAPSULE | Freq: Once | ORAL | Status: AC
Start: 2024-02-15 — End: 2024-02-15
  Administered 2024-02-15: 50 mg via ORAL

## 2024-02-15 MED ORDER — ACETAMINOPHEN 500 MG PO TABS
500.0000 mg | ORAL_TABLET | Freq: Once | ORAL | Status: AC
  Administered 2024-02-15: 500 mg via ORAL

## 2024-02-15 MED ORDER — METHYLPREDNISOLONE SODIUM SUCC 125 MG IJ SOLR
INTRAMUSCULAR | Status: AC
  Filled 2024-02-15: qty 2

## 2024-02-15 MED ORDER — DIPHENHYDRAMINE HCL 25 MG PO CAPS
ORAL_CAPSULE | ORAL | Status: AC
  Filled 2024-02-15: qty 2

## 2024-02-15 MED ORDER — SODIUM CHLORIDE 0.9 % IV SOLN
1000.0000 mg | Freq: Once | INTRAVENOUS | Status: AC
  Administered 2024-02-15: 1000 mg via INTRAVENOUS
  Filled 2024-02-15: qty 100

## 2024-06-06 ENCOUNTER — Other Ambulatory Visit

## 2024-06-06 DIAGNOSIS — R972 Elevated prostate specific antigen [PSA]: Secondary | ICD-10-CM

## 2024-06-06 DIAGNOSIS — N401 Enlarged prostate with lower urinary tract symptoms: Secondary | ICD-10-CM

## 2024-06-07 ENCOUNTER — Ambulatory Visit: Payer: Self-pay | Admitting: Urology

## 2024-06-07 LAB — PSA: Prostate Specific Ag, Serum: 6.1 ng/mL — ABNORMAL HIGH (ref 0.0–4.0)

## 2024-07-19 ENCOUNTER — Ambulatory Visit
Admission: RE | Admit: 2024-07-19 | Discharge: 2024-07-19 | Disposition: A | Discharge status: Home / Self Care | Source: Ambulatory Visit | Attending: Rheumatology | Admitting: Rheumatology

## 2024-07-19 DIAGNOSIS — M3131 Wegener's granulomatosis with renal involvement: Secondary | ICD-10-CM | POA: Insufficient documentation

## 2024-07-19 MED ORDER — SODIUM CHLORIDE 0.9 % IV SOLN
1000.0000 mg | Freq: Once | INTRAVENOUS | Status: AC
  Administered 2024-07-19: 1000 mg via INTRAVENOUS
  Filled 2024-07-19: qty 100

## 2024-07-19 MED ORDER — SODIUM CHLORIDE 0.9 % IV SOLN: 1000.0000 mg | Freq: Once | INTRAVENOUS | Status: DC

## 2024-07-19 MED ORDER — DIPHENHYDRAMINE HCL 25 MG PO CAPS
50.0000 mg | ORAL_CAPSULE | Freq: Once | ORAL | Status: AC
  Administered 2024-07-19: 50 mg via ORAL

## 2024-07-19 MED ORDER — ACETAMINOPHEN 500 MG PO TABS
500.0000 mg | ORAL_TABLET | Freq: Once | ORAL | Status: AC
  Administered 2024-07-19: 500 mg via ORAL

## 2024-07-19 MED ORDER — DIPHENHYDRAMINE HCL 25 MG PO CAPS
ORAL_CAPSULE | ORAL | Status: AC
  Filled 2024-07-19: qty 2

## 2024-07-19 MED ORDER — ACETAMINOPHEN 500 MG PO TABS
ORAL_TABLET | ORAL | Status: AC
  Filled 2024-07-19: qty 1

## 2024-07-19 MED ORDER — METHYLPREDNISOLONE SODIUM SUCC 125 MG IJ SOLR
100.0000 mg | Freq: Once | INTRAMUSCULAR | Status: DC
  Filled 2024-07-19: qty 2

## 2024-07-19 MED ORDER — METHYLPREDNISOLONE SODIUM SUCC 125 MG IJ SOLR
125.0000 mg | Freq: Once | INTRAMUSCULAR | Status: AC
  Administered 2024-07-19: 125 mg via INTRAVENOUS

## 2024-08-02 ENCOUNTER — Other Ambulatory Visit: Payer: Self-pay | Admitting: Physician Assistant

## 2024-08-02 DIAGNOSIS — I7121 Aneurysm of the ascending aorta, without rupture: Secondary | ICD-10-CM

## 2024-08-02 DIAGNOSIS — I119 Hypertensive heart disease without heart failure: Secondary | ICD-10-CM

## 2024-08-24 ENCOUNTER — Other Ambulatory Visit (HOSPITAL_COMMUNITY): Payer: Self-pay | Admitting: Rheumatology

## 2024-09-05 ENCOUNTER — Ambulatory Visit
Admission: RE | Admit: 2024-09-05 | Discharge: 2024-09-05 | Disposition: A | Discharge status: Home / Self Care | Source: Ambulatory Visit | Attending: Physician Assistant | Admitting: Physician Assistant

## 2024-09-05 DIAGNOSIS — I7121 Aneurysm of the ascending aorta, without rupture: Secondary | ICD-10-CM | POA: Diagnosis present

## 2024-09-05 DIAGNOSIS — I119 Hypertensive heart disease without heart failure: Secondary | ICD-10-CM | POA: Diagnosis present

## 2024-09-05 MED ORDER — IOHEXOL 350 MG/ML SOLN
75.0000 mL | Freq: Once | INTRAVENOUS | Status: AC | PRN
Start: 2024-09-05 — End: 2024-09-05
  Administered 2024-09-05: 75 mL via INTRAVENOUS

## 2024-11-24 ENCOUNTER — Telehealth (HOSPITAL_COMMUNITY): Payer: Self-pay

## 2024-11-24 NOTE — Telephone Encounter (Addendum)
 Auth Submission: NO AUTH NEEDED Site of care: Site of care: [facility] Payer: Medicare A/B, [employer] Medication & CPT/J Code(s) submitted: Rituxan  (Rituximab ) [id number] Diagnosis Code: M31.31, M31.30/I77.6 Route of submission (phone, fax, portal):  Phone # Fax # Auth type: Buy/Bill HB Units/visits requested: 1000mg  q70months Reference number:  Approval from: 11/24/24 to 11/16/25

## 2024-11-25 ENCOUNTER — Other Ambulatory Visit: Payer: Self-pay | Admitting: *Deleted

## 2024-12-15 ENCOUNTER — Encounter: Payer: Self-pay | Admitting: Rheumatology

## 2024-12-22 ENCOUNTER — Encounter: Payer: Self-pay | Admitting: Rheumatology

## 2024-12-22 ENCOUNTER — Ambulatory Visit
Admission: RE | Admit: 2024-12-22 | Discharge: 2024-12-22 | Disposition: A | Source: Ambulatory Visit | Attending: Rheumatology | Admitting: Rheumatology

## 2024-12-22 VITALS — BP 154/76 | HR 69 | Temp 96.4°F | Resp 17

## 2024-12-22 DIAGNOSIS — M3131 Wegener's granulomatosis with renal involvement: Secondary | ICD-10-CM

## 2024-12-22 DIAGNOSIS — I776 Arteritis, unspecified: Secondary | ICD-10-CM

## 2024-12-22 MED ORDER — ACETAMINOPHEN 500 MG PO TABS
ORAL_TABLET | ORAL | Status: AC
  Filled 2024-12-22: qty 2

## 2024-12-22 MED ORDER — SODIUM CHLORIDE 0.9 % IV SOLN
1000.0000 mg | Freq: Once | INTRAVENOUS | Status: AC
  Administered 2024-12-22: 1000 mg via INTRAVENOUS
  Filled 2024-12-22: qty 100

## 2024-12-22 MED ORDER — DIPHENHYDRAMINE HCL 25 MG PO CAPS
ORAL_CAPSULE | ORAL | Status: AC
  Filled 2024-12-22: qty 2

## 2024-12-22 MED ORDER — DIPHENHYDRAMINE HCL 25 MG PO CAPS
50.0000 mg | ORAL_CAPSULE | Freq: Once | ORAL | Status: AC
  Administered 2024-12-22: 50 mg via ORAL

## 2024-12-22 MED ORDER — METHYLPREDNISOLONE SODIUM SUCC 125 MG IJ SOLR
125.0000 mg | Freq: Once | INTRAMUSCULAR | Status: AC
  Administered 2024-12-22: 125 mg via INTRAVENOUS
  Filled 2024-12-22: qty 2

## 2024-12-22 MED ORDER — ACETAMINOPHEN 500 MG PO TABS
500.0000 mg | ORAL_TABLET | Freq: Once | ORAL | Status: AC
  Administered 2024-12-22: 500 mg via ORAL

## 2024-12-23 ENCOUNTER — Ambulatory Visit: Admission: RE | Admit: 2024-12-23 | Source: Ambulatory Visit

## 2025-02-01 ENCOUNTER — Other Ambulatory Visit

## 2025-02-03 ENCOUNTER — Ambulatory Visit: Admitting: Urology

## 2025-05-22 ENCOUNTER — Encounter
